# Patient Record
Sex: Male | Born: 1975
Health system: Southern US, Community
[De-identification: ages and names within clinical notes are randomized; demographics above are authoritative.]

## PROBLEM LIST (undated history)

## (undated) DIAGNOSIS — G43909 Migraine, unspecified, not intractable, without status migrainosus: Secondary | ICD-10-CM

## (undated) DIAGNOSIS — R519 Headache, unspecified: Secondary | ICD-10-CM

## (undated) HISTORY — DX: Headache, unspecified: R51.9

---

## 2008-07-16 ENCOUNTER — Ambulatory Visit: Payer: Self-pay | Admitting: Family Medicine

## 2008-07-16 DIAGNOSIS — R252 Cramp and spasm: Secondary | ICD-10-CM

## 2008-07-16 DIAGNOSIS — M25569 Pain in unspecified knee: Secondary | ICD-10-CM

## 2008-07-16 DIAGNOSIS — G44209 Tension-type headache, unspecified, not intractable: Secondary | ICD-10-CM

## 2008-07-16 DIAGNOSIS — K644 Residual hemorrhoidal skin tags: Secondary | ICD-10-CM | POA: Insufficient documentation

## 2008-07-16 DIAGNOSIS — F329 Major depressive disorder, single episode, unspecified: Secondary | ICD-10-CM

## 2008-07-17 LAB — CONVERTED CEMR LAB
BUN: 10 mg/dL (ref 6–23)
CO2: 34 meq/L — ABNORMAL HIGH (ref 19–32)
Calcium: 9.1 mg/dL (ref 8.4–10.5)
Creatinine, Ser: 1 mg/dL (ref 0.4–1.5)
GFR calc non Af Amer: 93 mL/min

## 2008-10-04 ENCOUNTER — Encounter (INDEPENDENT_AMBULATORY_CARE_PROVIDER_SITE_OTHER): Payer: Self-pay | Admitting: *Deleted

## 2012-07-22 ENCOUNTER — Emergency Department: Payer: Self-pay | Admitting: Emergency Medicine

## 2012-07-22 LAB — URINALYSIS, COMPLETE
Bacteria: NONE SEEN
Bilirubin,UR: NEGATIVE
Blood: NEGATIVE
Glucose,UR: NEGATIVE mg/dL (ref 0–75)
Ketone: NEGATIVE
Leukocyte Esterase: NEGATIVE
Nitrite: NEGATIVE
Ph: 6 (ref 4.5–8.0)

## 2012-07-22 LAB — COMPREHENSIVE METABOLIC PANEL
Bilirubin,Total: 0.6 mg/dL (ref 0.2–1.0)
Chloride: 104 mmol/L (ref 98–107)
Creatinine: 1.08 mg/dL (ref 0.60–1.30)
EGFR (African American): >60
SGPT (ALT): 35 U/L (ref 12–78)
Total Protein: 7.5 g/dL (ref 6.4–8.2)

## 2012-07-22 LAB — CBC
HCT: 37.9 % — ABNORMAL LOW (ref 40.0–52.0)
MCV: 84 fL (ref 80–100)
RBC: 4.53 10*6/uL (ref 4.40–5.90)
WBC: 7.1 10*3/uL (ref 3.8–10.6)

## 2012-07-22 LAB — TROPONIN I: Troponin-I: <0.02 ng/mL

## 2013-03-30 ENCOUNTER — Other Ambulatory Visit: Payer: Self-pay | Admitting: Otolaryngology

## 2013-03-30 DIAGNOSIS — J029 Acute pharyngitis, unspecified: Secondary | ICD-10-CM

## 2013-03-30 DIAGNOSIS — R0989 Other specified symptoms and signs involving the circulatory and respiratory systems: Secondary | ICD-10-CM

## 2013-03-30 DIAGNOSIS — K219 Gastro-esophageal reflux disease without esophagitis: Secondary | ICD-10-CM

## 2013-04-20 ENCOUNTER — Ambulatory Visit
Admission: RE | Admit: 2013-04-20 | Discharge: 2013-04-20 | Disposition: A | Discharge status: Home / Self Care | Payer: Commercial Managed Care - PPO | Source: Ambulatory Visit | Attending: Otolaryngology | Admitting: Otolaryngology

## 2013-04-20 DIAGNOSIS — J029 Acute pharyngitis, unspecified: Secondary | ICD-10-CM

## 2013-04-20 DIAGNOSIS — R0989 Other specified symptoms and signs involving the circulatory and respiratory systems: Secondary | ICD-10-CM

## 2013-04-20 DIAGNOSIS — K219 Gastro-esophageal reflux disease without esophagitis: Secondary | ICD-10-CM

## 2019-06-14 ENCOUNTER — Emergency Department
Admission: EM | Admit: 2019-06-14 | Discharge: 2019-06-14 | Disposition: A | Discharge status: Home / Self Care | Payer: Managed Care, Other (non HMO) | Attending: Emergency Medicine | Admitting: Emergency Medicine

## 2019-06-14 ENCOUNTER — Emergency Department: Payer: Managed Care, Other (non HMO)

## 2019-06-14 ENCOUNTER — Encounter: Payer: Self-pay | Admitting: Emergency Medicine

## 2019-06-14 ENCOUNTER — Other Ambulatory Visit: Payer: Self-pay

## 2019-06-14 DIAGNOSIS — R079 Chest pain, unspecified: Secondary | ICD-10-CM | POA: Diagnosis not present

## 2019-06-14 DIAGNOSIS — R519 Headache, unspecified: Secondary | ICD-10-CM | POA: Insufficient documentation

## 2019-06-14 DIAGNOSIS — M25512 Pain in left shoulder: Secondary | ICD-10-CM | POA: Insufficient documentation

## 2019-06-14 DIAGNOSIS — R42 Dizziness and giddiness: Secondary | ICD-10-CM | POA: Insufficient documentation

## 2019-06-14 DIAGNOSIS — M542 Cervicalgia: Secondary | ICD-10-CM | POA: Diagnosis not present

## 2019-06-14 DIAGNOSIS — M25511 Pain in right shoulder: Secondary | ICD-10-CM | POA: Insufficient documentation

## 2019-06-14 HISTORY — DX: Migraine, unspecified, not intractable, without status migrainosus: G43.909

## 2019-06-14 LAB — CBC
HCT: 37.8 % — ABNORMAL LOW (ref 39.0–52.0)
Hemoglobin: 11.8 g/dL — ABNORMAL LOW (ref 13.0–17.0)
MCH: 26.7 pg (ref 26.0–34.0)
MCHC: 31.2 g/dL (ref 30.0–36.0)
MCV: 85.5 fL (ref 80.0–100.0)
Platelets: 253 10*3/uL (ref 150–400)
RBC: 4.42 MIL/uL (ref 4.22–5.81)
RDW: 12.7 % (ref 11.5–15.5)
WBC: 9.5 10*3/uL (ref 4.0–10.5)
nRBC: 0 % (ref 0.0–0.2)

## 2019-06-14 LAB — BASIC METABOLIC PANEL
Anion gap: 7 (ref 5–15)
BUN: 14 mg/dL (ref 6–20)
CO2: 29 mmol/L (ref 22–32)
Calcium: 8.5 mg/dL — ABNORMAL LOW (ref 8.9–10.3)
Chloride: 107 mmol/L (ref 98–111)
Creatinine, Ser: 1.15 mg/dL (ref 0.61–1.24)
GFR calc Af Amer: >60 mL/min (ref >60)
GFR calc non Af Amer: >60 mL/min (ref >60)
Glucose, Bld: 89 mg/dL (ref 70–99)
Potassium: 4.4 mmol/L (ref 3.5–5.1)
Sodium: 143 mmol/L (ref 135–145)

## 2019-06-14 LAB — TROPONIN I (HIGH SENSITIVITY)
Troponin I (High Sensitivity): 5 ng/L (ref <18)
Troponin I (High Sensitivity): 4 ng/L (ref <18)

## 2019-06-14 MED ORDER — IOHEXOL 350 MG/ML SOLN
75.0000 mL | Freq: Once | INTRAVENOUS | Status: AC | PRN
  Administered 2019-06-14: 75 mL via INTRAVENOUS
  Filled 2019-06-14: qty 75

## 2019-06-14 NOTE — ED Provider Notes (Signed)
[facility] Emergency Department Provider Note  Time seen: 1:42 PM  I have reviewed the triage vital signs and the nursing notes.   HISTORY  Chief Complaint Chest Pain   HPI Glen Lane is a 43 y.o. male with a past medical history of migraines presents to the emergency department for migraine and chest pain.  According to the patient around 9:00 this morning he developed sudden onset of central chest pain radiating to both shoulders and somewhat to his neck.  Patient states it felt like a weight was on his neck and shoulders.  Denies any shortness of breath cough or fever.  Denies any leg pain or swelling.  No history of cardiac disease or blood clots in the past.  Patient also states he was having a migraine earlier today but states that is very typical for him.  States he was feeling somewhat dizzy at work after the chest pain started but that has since resolved.  States most of the pain in the chest has resolved but continues to have a heavy weight feeling on both of his shoulders and the back of his lower neck.  Denies any weakness or numbness.   Past Medical History:  Diagnosis Date  . Migraines     Patient Active Problem List   Diagnosis Date Noted  . HEADACHE, TENSION 07/16/2008  . DEPRESSION 07/16/2008  . EXTERNAL HEMORRHOIDS 07/16/2008  . LEG, LOWER, PAIN 07/16/2008  . LEG CRAMPS 07/16/2008    History reviewed. No pertinent surgical history.  Prior to Admission medications   Not on File    No Known Allergies  No family history on file.  Social History Social History   Tobacco Use  . Smoking status: Never Smoker  . Smokeless tobacco: Never Used  Substance Use Topics  . Alcohol use: Not Currently  . Drug use: Not Currently    Review of Systems Constitutional: Negative for fever. Cardiovascular: Positive for chest pain, now resolved. Respiratory: Negative for shortness of breath.  Negative for cough. Gastrointestinal: Negative for  abdominal pain, vomiting  Musculoskeletal: Bilateral shoulder pain and lower neck pain Skin: Negative for skin complaints  Neurological: Moderate headache earlier, mild to minimal currently. All other ROS negative  ____________________________________________   PHYSICAL EXAM:  VITAL SIGNS: ED Triage Vitals  Enc Vitals Group     BP 06/14/19 1155 137/85     Pulse Rate 06/14/19 1155 66     Resp 06/14/19 1155 16     Temp 06/14/19 1155 99.1 F (37.3 C)     Temp src --      SpO2 06/14/19 1155 97 %     Weight 06/14/19 1156 265 lb (120.2 kg)     Height 06/14/19 1156 6' (1.829 m)     Head Circumference --      Peak Flow --      Pain Score 06/14/19 1155 6     Pain Loc --      Pain Edu? --      Excl. in [place]? --     Constitutional: Alert and oriented. Well appearing and in no distress. Eyes: Normal exam ENT      Head: Normocephalic and atraumatic.      Mouth/Throat: Mucous membranes are moist. Cardiovascular: Normal rate, regular rhythm.  No murmur. Respiratory: Normal respiratory effort without tachypnea nor retractions. Breath sounds are clear and equal Gastrointestinal: Soft and nontender. No distention.   Musculoskeletal: Nontender with normal range of motion in all extremities. No lower  extremity tenderness or edema.  Chest is nontender to palpation. Neurologic:  Normal speech and language. No gross focal neurologic deficits  Skin:  Skin is warm, dry and intact.  Psychiatric: Mood and affect are normal.   ____________________________________________    EKG  EKG viewed and interpreted by myself shows a normal sinus rhythm at 67 bpm with a narrow QRS, normal axis, normal intervals, no concerning ST changes  ____________________________________________    RADIOLOGY  Chest x-ray negative.  ____________________________________________   INITIAL IMPRESSION / ASSESSMENT AND PLAN / ED COURSE  Pertinent labs & imaging results that were available during my care of the  patient were reviewed by me and considered in my medical decision making (see chart for details).   Patient presents for sudden onset of sharp chest pain radiating to both shoulders into his lower neck.  No history of the same.  States the chest pain is since resolved but continues to have neck and shoulder pain.  Differential would include ACS, PE less likely dissection, anxiety/stress, musculoskeletal pain.  Overall the patient's exam is reassuring.  Nontender chest.  Lab work largely within normal limits, troponin negative.  However given the sudden onset of sharp pain radiating to his shoulders and neck I discussed the pros and cons of CT imaging, patient agreeable to proceed with CTA of the chest.  We will also repeat a troponin at that time.  If the patient CTA and troponin are negative anticipate likely discharge home with PCP follow-up.  Repeat troponin is negative.  CTA is negative.  Overall the patient appears very well.  We will discharge home with PCP follow-up in my typical chest pain return precautions.  [name] was evaluated in Emergency Department on 06/14/2019 for the symptoms described in the history of present illness. He was evaluated in the context of the global COVID-19 pandemic, which necessitated consideration that the patient might be at risk for infection with the SARS-CoV-2 virus that causes COVID-19. Institutional protocols and algorithms that pertain to the evaluation of patients at risk for COVID-19 are in a state of rapid change based on information released by regulatory bodies including the CDC and federal and state organizations. These policies and algorithms were followed during the patient's care in the ED.  ____________________________________________   FINAL CLINICAL IMPRESSION(S) / ED DIAGNOSES  Chest pain   Minna Antis, MD 06/14/19 1523

## 2019-06-14 NOTE — ED Triage Notes (Signed)
Pt in via POV, reports sudden onset left side chest pain w/ pain across both shoulders as well.  Reports associated dizziness, light headedness.  Denies hx of same.  NAD noted at this time.

## 2019-06-14 NOTE — ED Notes (Signed)
Pt VS did in error by this RN at 1155

## 2020-04-25 ENCOUNTER — Ambulatory Visit: Payer: Self-pay

## 2020-07-07 ENCOUNTER — Other Ambulatory Visit: Payer: Self-pay

## 2020-07-07 ENCOUNTER — Emergency Department: Payer: BC Managed Care – PPO

## 2020-07-07 ENCOUNTER — Emergency Department
Admission: EM | Admit: 2020-07-07 | Discharge: 2020-07-07 | Disposition: A | Discharge status: Home / Self Care | Payer: BC Managed Care – PPO | Attending: Emergency Medicine | Admitting: Emergency Medicine

## 2020-07-07 ENCOUNTER — Encounter: Payer: Self-pay | Admitting: Emergency Medicine

## 2020-07-07 DIAGNOSIS — Z20822 Contact with and (suspected) exposure to covid-19: Secondary | ICD-10-CM | POA: Insufficient documentation

## 2020-07-07 DIAGNOSIS — B349 Viral infection, unspecified: Secondary | ICD-10-CM | POA: Diagnosis not present

## 2020-07-07 DIAGNOSIS — R0602 Shortness of breath: Secondary | ICD-10-CM

## 2020-07-07 DIAGNOSIS — R0789 Other chest pain: Secondary | ICD-10-CM | POA: Diagnosis not present

## 2020-07-07 LAB — BASIC METABOLIC PANEL
Anion gap: 8 (ref 5–15)
BUN: 12 mg/dL (ref 6–20)
CO2: 26 mmol/L (ref 22–32)
Calcium: 8.7 mg/dL — ABNORMAL LOW (ref 8.9–10.3)
Chloride: 103 mmol/L (ref 98–111)
Creatinine, Ser: 1.02 mg/dL (ref 0.61–1.24)
GFR, Estimated: >60 mL/min (ref >60)
Glucose, Bld: 113 mg/dL — ABNORMAL HIGH (ref 70–99)
Potassium: 3.6 mmol/L (ref 3.5–5.1)
Sodium: 137 mmol/L (ref 135–145)

## 2020-07-07 LAB — CBC
HCT: 41.7 % (ref 39.0–52.0)
Hemoglobin: 13.6 g/dL (ref 13.0–17.0)
MCH: 27.2 pg (ref 26.0–34.0)
MCHC: 32.6 g/dL (ref 30.0–36.0)
MCV: 83.4 fL (ref 80.0–100.0)
Platelets: 280 10*3/uL (ref 150–400)
RBC: 5 MIL/uL (ref 4.22–5.81)
RDW: 12.7 % (ref 11.5–15.5)
WBC: 6.8 10*3/uL (ref 4.0–10.5)
nRBC: 0 % (ref 0.0–0.2)

## 2020-07-07 LAB — RESP PANEL BY RT-PCR (FLU A&B, COVID) ARPGX2
Influenza A by PCR: NEGATIVE
Influenza B by PCR: NEGATIVE
SARS Coronavirus 2 by RT PCR: NEGATIVE

## 2020-07-07 LAB — TROPONIN I (HIGH SENSITIVITY)
Troponin I (High Sensitivity): 4 ng/L (ref <18)
Troponin I (High Sensitivity): 3 ng/L (ref <18)

## 2020-07-07 MED ORDER — KETOROLAC TROMETHAMINE 30 MG/ML IJ SOLN
15.0000 mg | Freq: Once | INTRAMUSCULAR | Status: AC
  Administered 2020-07-07: 15 mg via INTRAVENOUS
  Filled 2020-07-07: qty 1

## 2020-07-07 MED ORDER — LACTATED RINGERS IV BOLUS
1000.0000 mL | Freq: Once | INTRAVENOUS | Status: AC
  Administered 2020-07-07: 1000 mL via INTRAVENOUS

## 2020-07-07 MED ORDER — ACETAMINOPHEN 500 MG PO TABS
1000.0000 mg | ORAL_TABLET | Freq: Once | ORAL | Status: AC
  Administered 2020-07-07: 1000 mg via ORAL
  Filled 2020-07-07: qty 2

## 2020-07-07 NOTE — ED Notes (Signed)
Pt reports symptoms for 2 days, following exposure to his father in law (who recently tested positive for covid).  Pt states breathing has improved, but headache has started to return

## 2020-07-07 NOTE — ED Provider Notes (Signed)
[facility] Emergency Department Provider Note ____________________________________________   First MD Initiated Contact with Patient 07/07/20 2032     (approximate)  I have reviewed the triage vital signs and the nursing notes.  HISTORY  Chief Complaint Chest Pain and Shortness of Breath   HPI Glen Lane is a 44 y.o. malewho presents to the ED for evaluation of chest pain and shortness of breath.  Chart review indicates history of migrainous headaches.  Patient reports that he is fully vaccinated for Covid, but has not received his booster shot.  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  Patient reports about 2 days of generalized feeling unwell, poor p.o. intake and intermittent chest pain.  He reports intermittent sharp chest pains that occur primarily at rest and with respirations.  He reports nonproductive cough without sputum production.  He reports a sensation of dyspnea at rest that occurred this afternoon, he told his wife about it, who urged him to come to the ED for evaluation.  He reports alteration to his taste and smell.  He denies any chest pain right now and indicates he just feels tired and is requesting water to thirst.  Reports that the sensation of a typical migraine headache today, but it self resolved without medication.   Past Medical History:  Diagnosis Date  . Migraines     Patient Active Problem List   Diagnosis Date Noted  . HEADACHE, TENSION 07/16/2008  . DEPRESSION 07/16/2008  . EXTERNAL HEMORRHOIDS 07/16/2008  . LEG, LOWER, PAIN 07/16/2008  . LEG CRAMPS 07/16/2008    History reviewed. No pertinent surgical history.  Prior to Admission medications   Not on File    Allergies Patient has no known allergies.  History reviewed. No pertinent family history.  Social History Social History   Tobacco Use  .  Smoking status: Never Smoker  . Smokeless tobacco: Never Used  Vaping Use  . Vaping Use: Never used  Substance Use Topics  . Alcohol use: Not Currently  . Drug use: Not Currently    Review of Systems  Constitutional: No fever/chills.  Positive generalized weakness. Eyes: No visual changes. ENT: No sore throat. Cardiovascular: Positive chest pain. Respiratory: Positive shortness of breath nonproductive cough. Gastrointestinal: No abdominal pain.  No nausea, no vomiting.  No diarrhea.  No constipation. Genitourinary: Negative for dysuria. Musculoskeletal: Negative for back pain. Skin: Negative for rash. Neurological: Negative for  focal weakness or numbness.  Positive for self having headache.  ____________________________________________   PHYSICAL EXAM:  VITAL SIGNS: Vitals:   07/07/20 2200 07/07/20 2230  BP: 124/89   Pulse: 80 77  Resp: 17 15  Temp:    SpO2: 98% 96%     Constitutional: Alert and oriented.  Tired or uncomfortable-appearing, but conversational in full sentences without distress. Eyes: Conjunctivae are normal. PERRL. EOMI. Head: Atraumatic. Nose: No congestion/rhinnorhea. Mouth/Throat: Mucous membranes are dry.  Oropharynx non-erythematous. Neck: No stridor. No cervical spine tenderness to palpation. Cardiovascular: Normal rate, regular rhythm. Grossly normal heart sounds.  Good peripheral circulation. Respiratory: Normal respiratory effort.  No retractions. Lungs CTAB. Gastrointestinal: Soft , nondistended, nontender to palpation. No CVA tenderness. Musculoskeletal: No lower extremity tenderness nor edema.  No joint effusions. No signs of acute trauma. Neurologic:  Normal speech and language. No gross focal neurologic deficits are appreciated. No gait instability noted. Cranial nerves II through XII intact 5/5 strength and sensation in all  4 extremities Skin:  Skin is warm, dry and intact. No rash noted. Psychiatric: Mood and affect are normal.  Speech and behavior are normal.  ____________________________________________   LABS (all labs ordered are listed, but only abnormal results are displayed)  Labs Reviewed  BASIC METABOLIC PANEL - Abnormal; Notable for the following components:      Result Value   Glucose, Bld 113 (*)    Calcium 8.7 (*)    All other components within normal limits  RESP PANEL BY RT-PCR (FLU A&B, COVID) ARPGX2  CBC  TROPONIN I (HIGH SENSITIVITY)  TROPONIN I (HIGH SENSITIVITY)   ____________________________________________  12 Lead EKG  91 bpm.  Normal axis.  Normal intervals.  Evidence of acute ischemia. ____________________________________________  RADIOLOGY  ED MD interpretation: 2 view CXR reviewed by me without evidence of acute cardiopulmonary pathology.  Official radiology report(s): DG Chest 2 View  Result Date: 07/07/2020 CLINICAL DATA:  Chest pressure, shortness of breath EXAM: CHEST - 2 VIEW COMPARISON:  Chest radiograph dated 06/14/2019 FINDINGS: The heart size and mediastinal contours are within normal limits. Both lungs are clear. The visualized skeletal structures are unremarkable. IMPRESSION: No active cardiopulmonary disease. Electronically Signed   By: Romona Curls M.D.   On: 07/07/2020 20:51    ____________________________________________   PROCEDURES and INTERVENTIONS  Procedure(s) performed (including Critical Care):  .1-3 Lead EKG Interpretation Performed by: Delton Prairie, MD Authorized by: Delton Prairie, MD     Interpretation: normal     ECG rate:  72   ECG rate assessment: normal     Rhythm: sinus rhythm     Ectopy: none     Conduction: normal      Medications  acetaminophen (TYLENOL) tablet 1,000 mg (1,000 mg Oral Given 07/07/20 2208)  ketorolac (TORADOL) 30 MG/ML injection 15 mg (15 mg Intravenous Given 07/07/20 2208)  lactated ringers bolus 1,000 mL (1,000 mLs Intravenous New Bag/Given 07/07/20 2207)     ____________________________________________   MDM / ED COURSE   Otherwise healthy 44 year old male presents to the ED after be exposed to Covid positive patient, with symptoms suggestive of COVID-19, but testing negative and likely amenable to outpatient management.  Normal vitals on room air.  Exam with entire discharging patient who has no evidence of additional acute pathology.  No evidence of distress, trauma or neurovascular deficits.  His blood work is unremarkable.  CXR shows no infiltrates to suggest bacterial pneumonia.  EKG is nonischemic.  His Covid test returns negative.  High suspicion for Covid regardless of this test result considering his constellation of symptoms and positive exposure at home.  While his first troponin is negative, second opponent is pending at the time of signout to oncoming provider.  He has no chest pain here in the ED and no indications at this time for additional work-up or hospitalization.  I suspect as long as his second troponin remains low, he will be suitable for outpatient management.   Clinical Course as of Jul 08 2319  Wynelle Link Jul 07, 2020  2251 Reassessed.  Patient reports improving symptoms.   [DS]    Clinical Course User Index [DS] Delton Prairie, MD    ____________________________________________   FINAL CLINICAL IMPRESSION(S) / ED DIAGNOSES  Final diagnoses:  Viral illness  Other chest pain  Shortness of breath     ED Discharge Orders    None       Shalon Salado Katrinka Blazing   Note:  This document was prepared using Dragon voice recognition software and may include  unintentional dictation errors.   Delton Prairie, MD 07/07/20 [phone]

## 2020-07-07 NOTE — ED Triage Notes (Addendum)
Pt arrived via POV with reports of headaches, pt has hx of migraines. Pt states earlier today he was vomiting. Pt also c/o chest pressure and shortness of breath.  Pt also states he feels like his BP is high but not currently on meds.   Pt also states he found out recently father-in-law is COVID + and pt has not been tested.

## 2020-07-07 NOTE — Discharge Instructions (Addendum)
As we discussed, you tested negative for COVID-19, your symptoms are highly suggestive of Covid or another viral illness causing you to feel poorly.  You have no evidence of pneumonia or other problems that would require antibiotics.  Because your father-in-law tested positive for Covid and you are now feeling poorly, I would urge you to treat this like you had Covid: Stay at home masked and quarantined.  It may be reasonable to get another Covid test here in the next couple days to see if it returns positive.  Please take Tylenol and ibuprofen/Advil for your pain.  It is safe to take them together, or to alternate them every few hours.  Take up to 1000mg  of Tylenol at a time, up to 4 times per day.  Do not take more than 4000 mg of Tylenol in 24 hours.  For ibuprofen, take 400-600 mg, 4-5 times per day.  Push plenty of fluids to maintain hydration and continue your normal medications to treat any migraines you may have.  If you develop any significant worsening symptoms, particular chest pains accompanied by passing out, fevers or worsening symptoms, please return to the ED.

## 2021-02-13 ENCOUNTER — Ambulatory Visit: Payer: BC Managed Care – PPO | Admitting: Family Medicine

## 2021-02-13 ENCOUNTER — Encounter: Payer: Self-pay | Admitting: Family Medicine

## 2021-02-13 ENCOUNTER — Other Ambulatory Visit: Payer: Self-pay

## 2021-02-13 VITALS — BP 129/77 | HR 72 | Ht 71.0 in | Wt 265.4 lb

## 2021-02-13 DIAGNOSIS — G43919 Migraine, unspecified, intractable, without status migrainosus: Secondary | ICD-10-CM | POA: Diagnosis not present

## 2021-02-13 DIAGNOSIS — R519 Headache, unspecified: Secondary | ICD-10-CM | POA: Insufficient documentation

## 2021-02-13 MED ORDER — NURTEC 75 MG PO TBDP: 75.0000 mg | ORAL_TABLET | Freq: Every day | ORAL | 2 refills | Status: AC | PRN

## 2021-02-13 NOTE — Progress Notes (Signed)
Subjective:    Patient ID: Glen Lane, male    DOB: Sep 29, 1975, 45 y.o.   MRN: [id number]  Glen Lane is a 45 y.o. male presenting on 02/13/2021 for Establish Care and Migraine  Establish care. No prior regular PCP recently. Lives in [place].  HPI  Chronic Migraines without aura Chronic problem since approx 2008. Unsure if any family history of migraines. He previously [facility] [place] in past He has contacted [facility] ([facility] initially regarding migraines.  Location: Frontal / bilateral temporal, also some posterior occipital region as well. Quality: throbbing frontal, and aching posteriorly Duration of headache without treatment: up to >24 hours He can get mild headaches, that are not migraines. More often he will get actual migraine HA. Current Frequency Migraines: 2 or more a week Longest duration without headache: 2 weeks without (longest recently) Accompanying symptoms:  nausea, vomiting (usually improves after vomit), sensitivity to smells, lacrimation, worsened with activity, generalized fatigue/malaise weakness  Effective treatment:  Procedures in past - tried botox injections from [facility] in past, temporarily helped. Ineffective treatment - Abortive (Sumatriptan, Rizatriptan), Preventative (Topamax, Propranolol) Has not taken SNRI SSRI TCA Gabapentin  Known triggers: Weather (high pressure storms), Intermediate fasting, no particular foods that can trigger but smells can worsen it. Possibly smoke can trigger.   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  He tried some intermediate fasting and it provoked some migraine headache. He said his wife does this diet trend and he wanted to try it.   Health  Maintenance:  COVID19 vaccine, received x 2 doses + 1 booster. Will bring card in future.   Depression screen PHQ 2/9 02/13/2021  Decreased Interest 1  Down, Depressed, Hopeless 1  PHQ - 2 Score 2  Altered sleeping 1  Tired, decreased energy 1  Change in appetite 0  Feeling bad or failure about yourself  0  Trouble concentrating 0  Moving slowly or fidgety/restless 0  Suicidal thoughts 0  PHQ-9 Score 4  Difficult doing work/chores Somewhat difficult    Past Medical History:  Diagnosis Date   Headache    Migraines    History reviewed. No pertinent surgical history. Social History   Socioeconomic History   Marital status: Married    Spouse name: Not on file   Number of children: 5   Years of education: Not on file   Highest education level: High school graduate  Occupational History   Not on file  Tobacco Use   Smoking status: Never   Smokeless tobacco: Never  Vaping Use   Vaping Use: Never used  Substance and Sexual Activity   Alcohol use: Never   Drug use: Never   Sexual activity: Not on file  Other Topics Concern   Not on file  Social History Narrative   Not on file   Social Determinants of Health   Financial Resource Strain: Not on file  Food Insecurity: Not on file  Transportation Needs: Not on file  Physical Activity: Not on file  Stress: Not on file  Social Connections: Not on file  Intimate Partner Violence: Not on file   Family History  Problem Relation Age of Onset   Alcohol abuse Father    Heart disease Father    Alzheimer's disease Father  Alzheimer's disease Maternal Grandmother 37   Alzheimer's disease Maternal Grandfather 80   No current outpatient medications on file prior to visit.   No current facility-administered medications on file prior to visit.    Review of Systems Per HPI unless specifically indicated above      Objective:    BP 129/77   Pulse 72   Ht 5\' 11"  (1.803 m)   Wt 265 lb 6.4 oz (120.4 kg)   SpO2 97%    BMI 37.02 kg/m   Wt Readings from Last 3 Encounters:  02/13/21 265 lb 6.4 oz (120.4 kg)  07/07/20 260 lb (117.9 kg)  06/14/19 265 lb (120.2 kg)    Physical Exam Vitals and nursing note reviewed.  Constitutional:      General: He is not in acute distress.    Appearance: Normal appearance. He is well-developed. He is not diaphoretic.     Comments: Well-appearing, comfortable, cooperative  HENT:     Head: Normocephalic and atraumatic.  Eyes:     General:        Right eye: No discharge.        Left eye: No discharge.     Conjunctiva/sclera: Conjunctivae normal.  Cardiovascular:     Rate and Rhythm: Normal rate.  Pulmonary:     Effort: Pulmonary effort is normal.  Skin:    General: Skin is warm and dry.     Findings: No erythema or rash.  Neurological:     Mental Status: He is alert and oriented to person, place, and time.  Psychiatric:        Mood and Affect: Mood normal.        Behavior: Behavior normal.        Thought Content: Thought content normal.     Comments: Well groomed, good eye contact, normal speech and thoughts      Results for orders placed or performed during the hospital encounter of 07/07/20  Resp Panel by RT-PCR (Flu A&B, Covid) Nasopharyngeal Swab   Specimen: Nasopharyngeal Swab; Nasopharyngeal(NP) swabs in vial transport medium  Result Value Ref Range   SARS Coronavirus 2 by RT PCR NEGATIVE NEGATIVE   Influenza A by PCR NEGATIVE NEGATIVE   Influenza B by PCR NEGATIVE NEGATIVE  Basic metabolic panel  Result Value Ref Range   Sodium 137 135 - 145 mmol/L   Potassium 3.6 3.5 - 5.1 mmol/L   Chloride 103 98 - 111 mmol/L   CO2 26 22 - 32 mmol/L   Glucose, Bld 113 (H) 70 - 99 mg/dL   BUN 12 6 - 20 mg/dL   Creatinine, Ser 07/09/20 0.61 - 1.24 mg/dL   Calcium 8.7 (L) 8.9 - 10.3 mg/dL   GFR, Estimated 0.45 >40 mL/min   Anion gap 8 5 - 15  CBC  Result Value Ref Range   WBC 6.8 4.0 - 10.5 K/uL   RBC 5.00 4.22 - 5.81 MIL/uL   Hemoglobin 13.6 13.0 - 17.0 g/dL    HCT >98 11.9 - 14.7 %   MCV 83.4 80.0 - 100.0 fL   MCH 27.2 26.0 - 34.0 pg   MCHC 32.6 30.0 - 36.0 g/dL   RDW 82.9 56.2 - 13.0 %   Platelets 280 150 - 400 K/uL   nRBC 0.0 0.0 - 0.2 %  Troponin I (High Sensitivity)  Result Value Ref Range   Troponin I (High Sensitivity) 4 <18 ng/L  Troponin I (High Sensitivity)  Result Value Ref Range   Troponin I (High Sensitivity)  3 <18 ng/L      Assessment & Plan:   Problem List Items Addressed This Visit   None Visit Diagnoses     Intractable migraine without status migrainosus, unspecified migraine type    -  Primary   Relevant Medications   NURTEC 75 MG TBDP   Other Relevant Orders   Ambulatory referral to Neurology      Consistent with chronic migraines without aura Onset 2008 >15 headache days per month Debilitating migraines impacting his daily function - Currently without active HA, well-appearing, no focal neuro deficits, tolerating PO w/o n/v  Past treatments failed Abortive - Sumatriptan, Rizatriptan, NSAIDs Preventative - Topamax, Propranolol, Botox injections Neurology consult in past w/ [facility]  Plan: 1. Start abortive therapy Nurtec ODT 75mg  daily PRN #8 pills 2 refills, through mail order pharmacy specialty 2. For minor HA - Recommend taking Ibuprofen 600-800mg  q 8 hr PRN, and can try Tylenol 1000mg  TID alternatively 3. Avoid triggers including foods, caffeine. Important to rest.  Return if uncontrolled or new concerns.  Referral to [facility] for further management of chronic migraines may benefit from other therapy in future.   Meds ordered this encounter  Medications   NURTEC 75 MG TBDP    Sig: Take 75 mg by mouth daily as needed (migraine headache). Max 1 tablet in 24 hours.    Dispense:  8 tablet    Refill:  2    30 day supply, Abortive therapy for migraine headache, failed sumatriptan, rizatriptan, topamax, propranolol, previously.       Follow up  plan: Return in about 7 months (around 09/16/2021) for 7 month Annual Physical AM apt with fasting lab AFTER.  Lucia Gaskins, DO [facility] 02/13/2021, 3:21 PM

## 2021-02-13 NOTE — Patient Instructions (Addendum)
Thank you for coming to the office today.  Dr Naomie Dean (Migraine Specialist)  [facility]   Address: [address] [place], [place] [address] Hours: 8AM-5PM Phone: [phone]  Stay tuned for contact from mail order - [facility], med should be mailed out within 24 hours, and insurance coverage if not they cover it.  Look into other medications in future to discuss with Dr Lucia Gaskins.  Emgality, Ajovy, Aimovig (injections prevention)  Bernita Raisin, Reyvow (pill prevention)  DUE for FASTING BLOOD WORK (no food or drink after midnight before the lab appointment, only water or coffee without cream/sugar on the morning of)  For Lab Results, once available within 2-3 days of blood draw, you can can log in to MyChart online to view your results and a brief explanation. Also, we can discuss results at next follow-up visit.    Please schedule a Follow-up Appointment to: Return in about 7 months (around 09/16/2021) for 7 month Annual Physical AM apt with fasting lab AFTER.  If you have any other questions or concerns, please feel free to call the office or send a message through MyChart. You may also schedule an earlier appointment if necessary.  Additionally, you may be receiving a survey about your experience at our office within a few days to 1 week by e-mail or mail. We value your feedback.  Saralyn Pilar, DO [facility], [place]

## 2021-02-14 ENCOUNTER — Ambulatory Visit: Payer: Self-pay | Admitting: Family Medicine

## 2021-02-28 ENCOUNTER — Telehealth: Payer: Self-pay | Admitting: Family Medicine

## 2021-02-28 NOTE — Telephone Encounter (Signed)
Caller name: Hayden Pedro  Relation to pt: from [employer] [phone] option [phone] fax # [phone]   Reason for call:  Checking on the status if PA for NURTEC 75 MG TBDP was received please confirm when received. Will refax to  [phone].

## 2021-03-06 NOTE — Telephone Encounter (Signed)
Completed PA and printed last office visit   Ready for fax  Saralyn Pilar, DO [facility] 03/06/2021, 12:59 PM

## 2021-03-07 NOTE — Telephone Encounter (Signed)
Faxed paperwork today Friday 03/07/21.

## 2021-05-19 ENCOUNTER — Ambulatory Visit: Payer: BC Managed Care – PPO | Admitting: Neurology

## 2021-05-19 ENCOUNTER — Encounter: Payer: Self-pay | Admitting: Neurology

## 2021-05-19 VITALS — BP 119/80 | HR 61 | Ht 70.0 in | Wt 247.6 lb

## 2021-05-19 DIAGNOSIS — G43009 Migraine without aura, not intractable, without status migrainosus: Secondary | ICD-10-CM

## 2021-05-19 MED ORDER — NURTEC 75 MG PO TBDP: 75.0000 mg | ORAL_TABLET | Freq: Every day | ORAL | 16 refills | Status: DC | PRN

## 2021-05-19 NOTE — Patient Instructions (Signed)
Nurtec once daily prn   Rimegepant oral dissolving tablet What is this medication? RIMEGEPANT (ri ME je pant) is used to treat migraine headaches with or without aura. An aura is a strange feeling or visual disturbance that warns you of an attack. It is also used to prevent migraine headaches. This medicine may be used for other purposes; ask your health care provider or pharmacist if you have questions. COMMON BRAND NAME(S): NURTEC ODT What should I tell my care team before I take this medication? They need to know if you have any of these conditions: kidney disease liver disease an unusual or allergic reaction to rimegepant, other medicines, foods, dyes, or preservatives pregnant or trying to get pregnant breast-feeding How should I use this medication? Take the medicine by mouth. Follow the directions on the prescription label. Leave the tablet in the sealed blister pack until you are ready to take it. With dry hands, open the blister and gently remove the tablet. If the tablet breaks or crumbles, throw it away and take a new tablet out of the blister pack. Place the tablet in the mouth and allow it to dissolve, and then swallow. Do not cut, crush, or chew this medicine. You do not need water to take this medicine. Talk to your pediatrician about the use of this medicine in children. Special care may be needed. Overdosage: If you think you have taken too much of this medicine contact a poison control center or emergency room at once. NOTE: This medicine is only for you. Do not share this medicine with others. What if I miss a dose? This does not apply. This medicine is not for regular use. What may interact with this medication? This medicine may interact with the following medications: certain medicines for fungal infections like fluconazole, itraconazole rifampin This list may not describe all possible interactions. Give your health care provider a list of all the medicines, herbs,  non-prescription drugs, or dietary supplements you use. Also tell them if you smoke, drink alcohol, or use illegal drugs. Some items may interact with your medicine. What should I watch for while using this medication? Visit your health care professional for regular checks on your progress. Tell your health care professional if your symptoms do not start to get better or if they get worse. What side effects may I notice from receiving this medication? Side effects that you should report to your doctor or health care professional as soon as possible: allergic reactions like skin rash, itching or hives; swelling of the face, lips, or tongue Side effects that usually do not require medical attention (report these to your doctor or health care professional if they continue or are bothersome): nausea This list may not describe all possible side effects. Call your doctor for medical advice about side effects. You may report side effects to FDA at 1-800-FDA-1088. Where should I keep my medication? Keep out of the reach of children and pets. Store at room temperature between 20 and 25 degrees C (68 and 77 degrees F). Get rid of any unused medicine after the expiration date. To get rid of medicines that are no longer needed or have expired: Take the medicine to a medicine take-back program. Check with your pharmacy or law enforcement to find a location. If you cannot return the medicine, check the label or package insert to see if the medicine should be thrown out in the garbage or flushed down the toilet. If you are not sure, ask your health  care provider. If it is safe to put it in the trash, take the medicine out of the container. Mix the medicine with cat litter, dirt, coffee grounds, or other unwanted substance. Seal the mixture in a bag or container. Put it in the trash. NOTE: This sheet is a summary. It may not cover all possible information. If you have questions about this medicine, talk to your  doctor, pharmacist, or health care provider.  2022 Elsevier/Gold Standard (2020-01-09 17:56:55)

## 2021-05-19 NOTE — Progress Notes (Signed)
[facility]    Provider:  Dr Lucia Gaskins Requesting Provider: Saralyn Pilar * Primary Care Provider:  Smitty Cords, DO  CC:  migraines  HPI:  Glen Lane is a 45 y.o. male here as requested by Saralyn Pilar * for chronic daily headaches. He has had headaches for a very long time. He can wake up with headaches. Can occur during the day. They start in the frontal area, base of the neck, into his shoulders radiating, throbbing, severe, he can barely function, affecting his work, he will wake up in the middle of the night with a headaches, can go to bed with it and wake up with it. He can be fine going to bed and then have nocturnal headaches. Snores a lot. Nurtec helps a lot. He take it acutely. Aspirin. Ha first migraine years ago, they got better but over the last year worsening (hasn't been to the headache clinic Dr Neale Burly for 10 years). Nurtec really helps. He has also been losing weight and exercising. At least 2x a week but nurtec helps. Untreated could last up to 24 hours, be severe, nausea, photophobia/phonophobia. Smoking is a trigger. A dark room helps. No aura. Weather is a huge trigger. No hx of head trauma. He had 8 mod to severe migraine days a month and not many other headaches days. No other focal neurologic deficits, associated symptoms, inciting events or modifiable factors.No other focal neurologic deficits, associated symptoms, inciting events or modifiable factors.  Reviewed notes, labs and imaging from outside physicians, which showed:  Medications tried: Sumatriptan, Rizatriptan, apirin Topamax, zonegran, propranolol, verapamil, amitriptyline, relpax   Review of Systems: Patient complains of symptoms per HPI as well as the following symptoms weight loss (intentional). Pertinent negatives and positives per HPI. All others negative.   Social History   Socioeconomic History   Marital status: Married    Spouse name: Not on file    Number of children: 5   Years of education: Not on file   Highest education level: High school graduate  Occupational History   Not on file  Tobacco Use   Smoking status: Never   Smokeless tobacco: Never  Vaping Use   Vaping Use: Never used  Substance and Sexual Activity   Alcohol use: Never   Drug use: Never   Sexual activity: Not on file  Other Topics Concern   Not on file  Social History Narrative   Not on file   Social Determinants of Health   Financial Resource Strain: Not on file  Food Insecurity: Not on file  Transportation Needs: Not on file  Physical Activity: Not on file  Stress: Not on file  Social Connections: Not on file  Intimate Partner Violence: Not on file    Family History  Problem Relation Age of Onset   Alcohol abuse Father    Heart disease Father    Alzheimer's disease Father    Alzheimer's disease Maternal Grandmother 73   Alzheimer's disease Maternal Grandfather 38   Migraines Neg Hx     Past Medical History:  Diagnosis Date   Headache    Migraines     Patient Active Problem List   Diagnosis Date Noted   Migraine without aura and without status migrainosus, not intractable 05/19/2021   Headache 02/13/2021   HEADACHE, TENSION 07/16/2008   DEPRESSION 07/16/2008   EXTERNAL HEMORRHOIDS 07/16/2008   LEG, LOWER, PAIN 07/16/2008   LEG CRAMPS 07/16/2008    History reviewed. No pertinent surgical history.  Current  Outpatient Medications  Medication Sig Dispense Refill   NURTEC 75 MG TBDP Take 75 mg by mouth daily as needed (migraine headache). Max 1 tablet in 24 hours. 8 tablet 2   Rimegepant Sulfate (NURTEC) 75 MG TBDP Take 75 mg by mouth daily as needed. For migraines. Take as close to onset of migraine as possible. One daily maximum. 10 tablet 16   No current facility-administered medications for this visit.    Allergies as of 05/19/2021   (No Known Allergies)    Vitals: BP 119/80   Pulse 61   Ht 5\' 10"  (1.778 m)   Wt 247  lb 9.6 oz (112.3 kg)   BMI 35.53 kg/m  Last Weight:  Wt Readings from Last 1 Encounters:  05/19/21 247 lb 9.6 oz (112.3 kg)   Last Height:   Ht Readings from Last 1 Encounters:  05/19/21 5\' 10"  (1.778 m)     Physical exam: Exam: Gen: NAD, conversant, well nourised, obese, well groomed                     CV: RRR, no MRG. No Carotid Bruits. No peripheral edema, warm, nontender Eyes: Conjunctivae clear without exudates or hemorrhage  Neuro: Detailed Neurologic Exam  Speech:    Speech is normal; fluent and spontaneous with normal comprehension.  Cognition:    The patient is oriented to person, place, and time;     recent and remote memory intact;     language fluent;     normal attention, concentration,     fund of knowledge Cranial Nerves:    The pupils are equal, round, and reactive to light. Pupils too small to visualize fundi. Visual fields are full to finger confrontation. Extraocular movements are intact. Trigeminal sensation is intact and the muscles of mastication are normal. The face is symmetric. The palate elevates in the midline. Hearing intact. Voice is normal. Shoulder shrug is normal. The tongue has normal motion without fasciculations.   Coordination:    Normal   Gait:   normal.   Motor Observation:    No asymmetry, no atrophy, and no involuntary movements noted. Tone:    Normal muscle tone.    Posture:    Posture is normal. normal erect    Strength:    Strength is V/V in the upper and lower limbs.      Sensation: intact to LT     Reflex Exam:  DTR's:    Deep tendon reflexes in the upper and lower extremities are normal bilaterally.   Toes:    The toes are downgoing bilaterally.   Clonus:    Clonus is absent.    Assessment/Plan:  Patient with episodic migraines. We had a long discussion about migraine prevention, acute management, reviewed all the new medications, he is happy with nurtec prn will continue. I suggsted an MRI of the brain and a  sleep stdy if symptoms worsen or do not improve.   Nurtec prn. He declines prevention at this time  he will wake up in the middle of the night with a headaches, can go to bed with it and wake up with it. He can be fine going to bed and then have nocturnal headaches. Snores a lot. Consider seeing sleep doctor to discuss sleep apnea testing.  No red flags, not exertional or positional or vision changes/ hearing changes, focal neurologic problems: hold off on MRI but low threshold in the future to check brain MRi esp if migraine and headaches not  improving.   Discussed: To prevent or relieve headaches, try the following: Cool Compress. Lie down and place a cool compress on your head.  Avoid headache triggers. If certain foods or odors seem to have triggered your migraines in the past, avoid them. A headache diary might help you identify triggers.  Include physical activity in your daily routine. Try a daily walk or other moderate aerobic exercise.  Manage stress. Find healthy ways to cope with the stressors, such as delegating tasks on your to-do list.  Practice relaxation techniques. Try deep breathing, yoga, massage and visualization.  Eat regularly. Eating regularly scheduled meals and maintaining a healthy diet might help prevent headaches. Also, drink plenty of fluids.  Follow a regular sleep schedule. Sleep deprivation might contribute to headaches Consider biofeedback. With this mind-body technique, you learn to control certain bodily functions -- such as muscle tension, heart rate and blood pressure -- to prevent headaches or reduce headache pain.    Proceed to emergency room if you experience new or worsening symptoms or symptoms do not resolve, if you have new neurologic symptoms or if headache is severe, or for any concerning symptom.   Provided education and documentation from American headache Society toolbox including articles on: chronic migraine medication overuse headache, chronic  migraines, prevention of migraines, behavioral and other nonpharmacologic treatments for headache.   No orders of the defined types were placed in this encounter.  Meds ordered this encounter  Medications   Rimegepant Sulfate (NURTEC) 75 MG TBDP    Sig: Take 75 mg by mouth daily as needed. For migraines. Take as close to onset of migraine as possible. One daily maximum.    Dispense:  10 tablet    Refill:  16    Dispense 16 or max allowed by insurance     Cc: Althea Charon, Saralyn Pilar, Netta Neat, DO  Naomie Dean, MD  [facility] [address] [place], [place] [address]  Phone [phone] Fax [phone]  I spent over 40 minutes of face-to-face and non-face-to-face time with patient on the  1. Migraine without aura and without status migrainosus, not intractable    diagnosis.  This included previsit chart review, lab review, study review, order entry, electronic health record documentation, patient education on the different diagnostic and therapeutic options, counseling and coordination of care, risks and benefits of management, compliance, or risk factor reduction

## 2021-07-14 ENCOUNTER — Telehealth: Payer: Self-pay | Admitting: *Deleted

## 2021-07-14 MED ORDER — UBRELVY 100 MG PO TABS: 100.0000 mg | ORAL_TABLET | ORAL | 1 refills | Status: DC | PRN

## 2021-07-14 NOTE — Addendum Note (Signed)
Addended by: Bertram Savin on: 07/14/2021 11:00 AM   Modules accepted: Orders

## 2021-07-14 NOTE — Telephone Encounter (Signed)
I called the pt and LVM (ok per DPR) advising pt that his insurance is not covering the Nurtec and instead they prefer Ubrelvy first. I advised that Dr Lucia Gaskins is willing to switch him to [place] and I left our office number for him to give [place] a call back and discuss.

## 2021-07-14 NOTE — Telephone Encounter (Signed)
Nurtec PA initiated on Cover My Meds. [employer] asking if patient has tried and failed Ubrelvy first. He has not per our records. Will address with Dr Lucia Gaskins.

## 2021-08-12 IMAGING — CR DG CHEST 2V
1 series · 2 of 2 positions shown · non-contrast
Comparison: Chest radiograph dated 06/14/2019

CLINICAL DATA: Chest pressure, shortness of breath

EXAM:
CHEST - 2 VIEW

[Series 1: dg chest 2 view · 0.14mm/px · 2 of 2 slices shown]
[im 1/2]
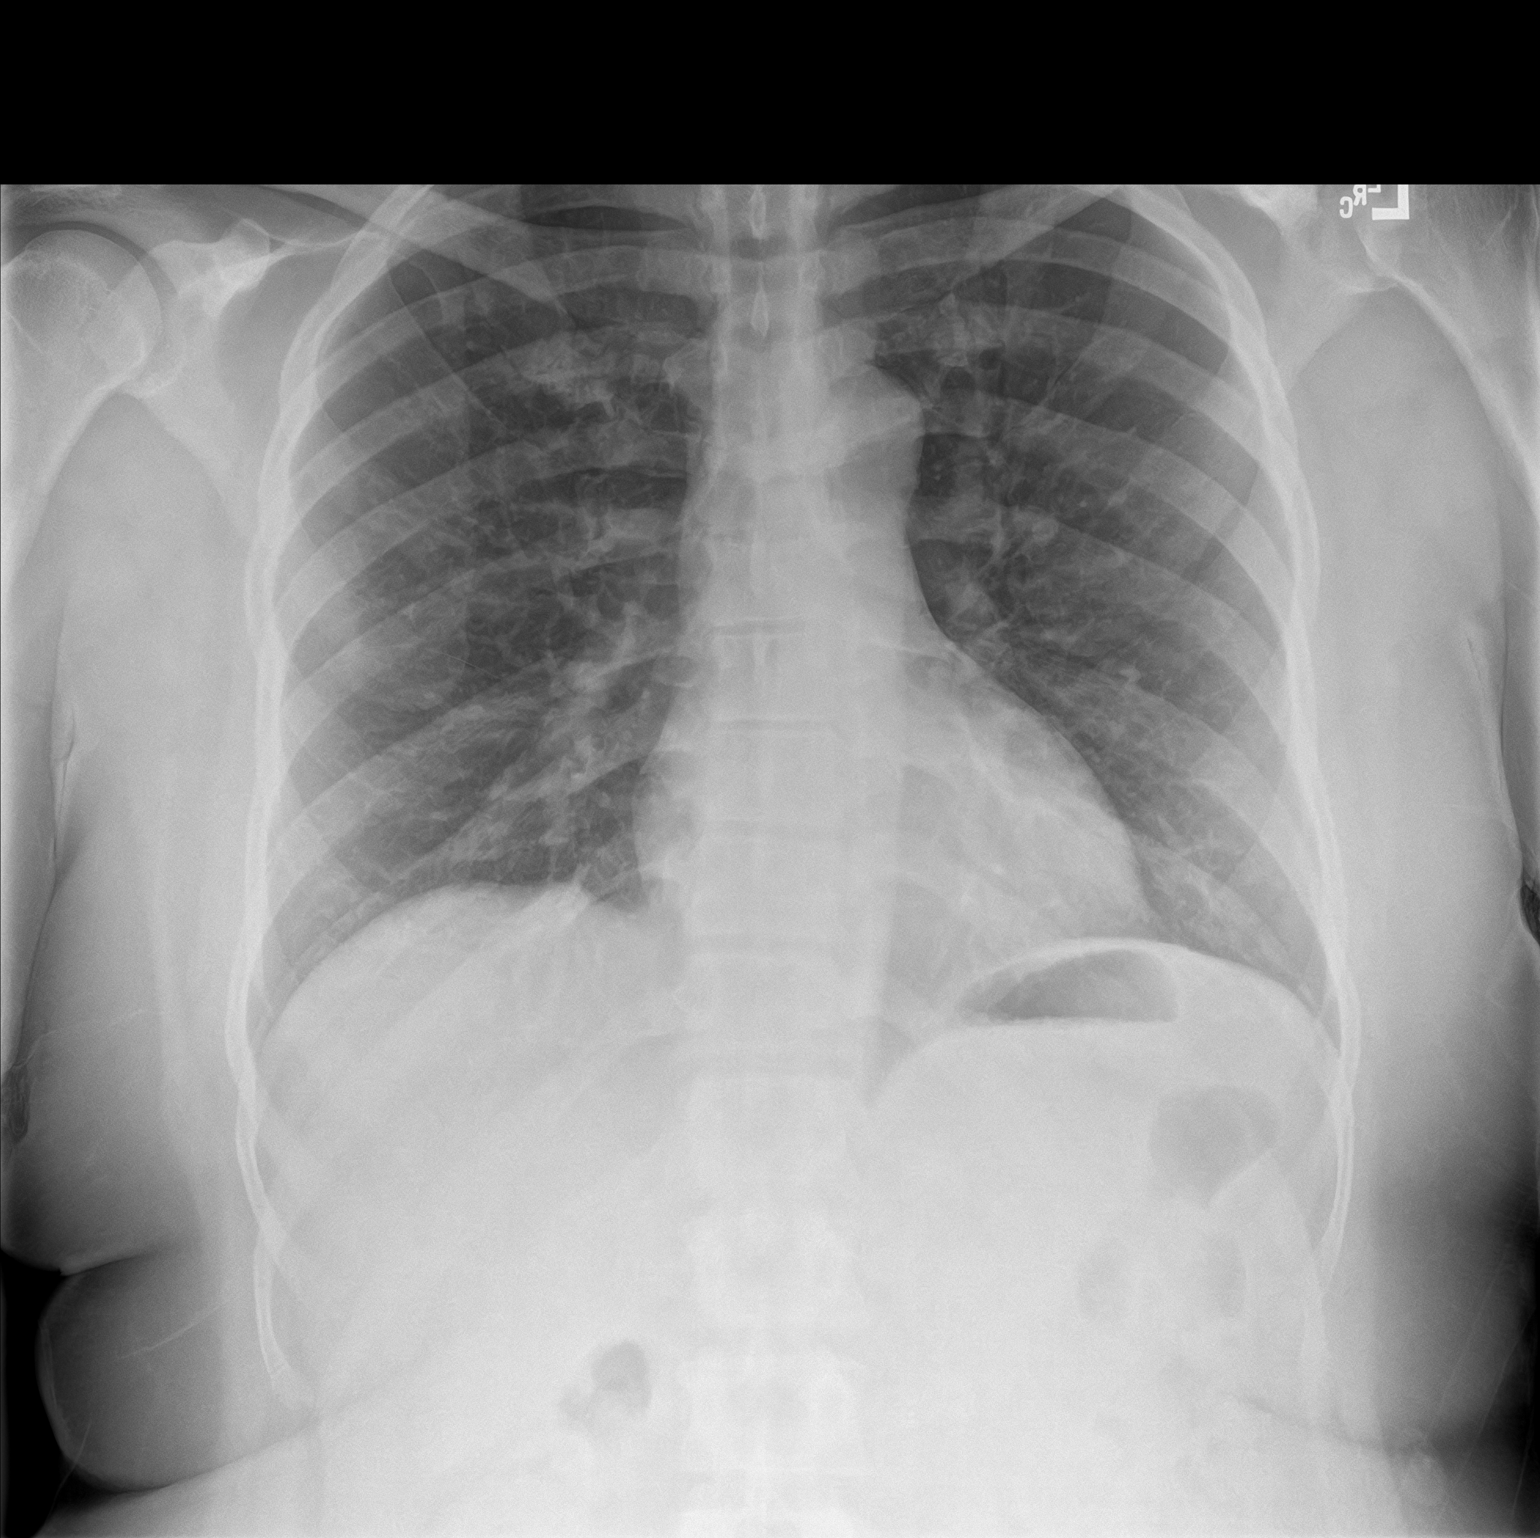
[im 2/2]
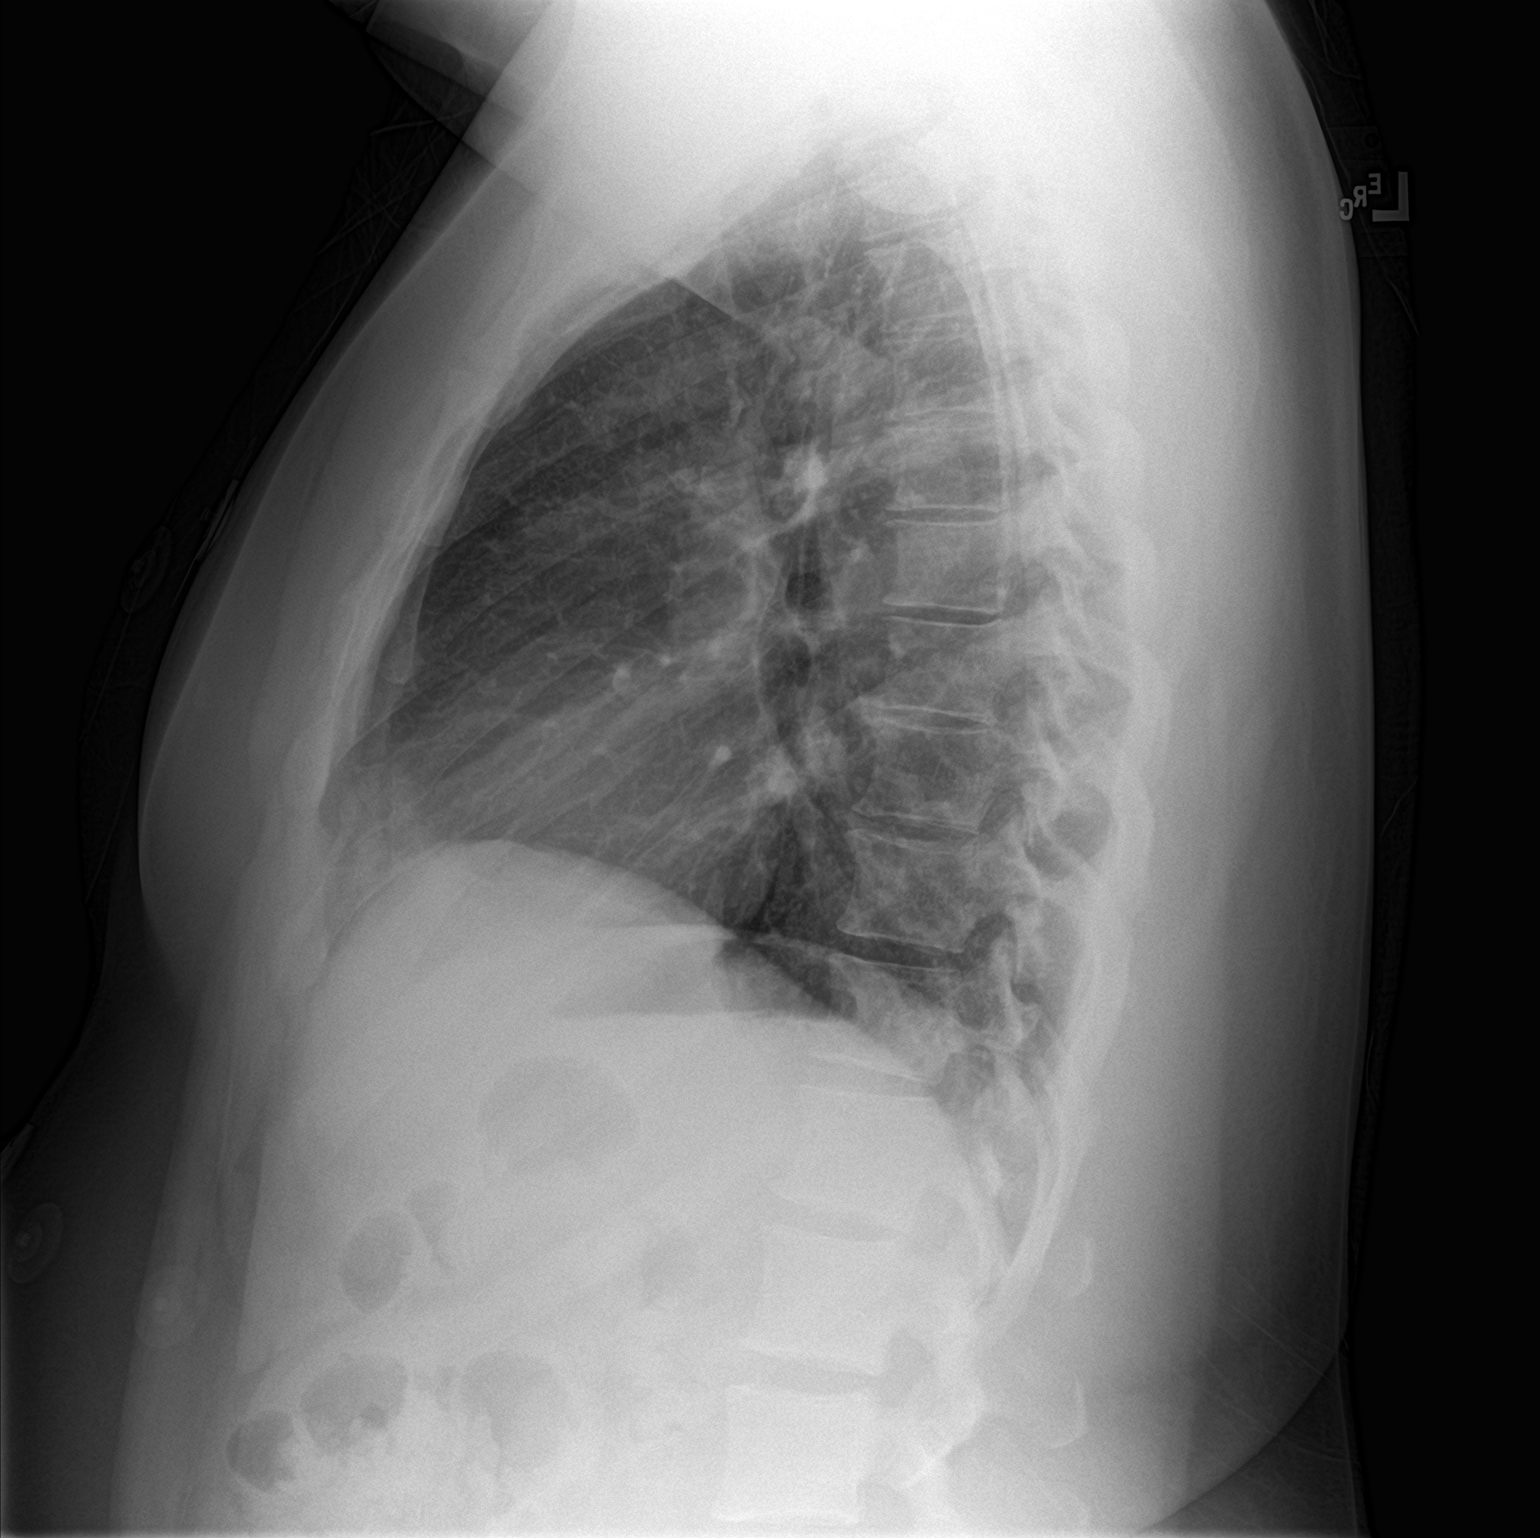

[2 of 2 positions shown; findings below may reference images not displayed]

FINDINGS: The heart size and mediastinal contours are within normal limits.
Both lungs are clear. The visualized skeletal structures are
unremarkable.
IMPRESSION: No active cardiopulmonary disease.

## 2021-08-13 ENCOUNTER — Telehealth: Payer: Self-pay | Admitting: *Deleted

## 2021-08-13 NOTE — Telephone Encounter (Signed)
Bernita Raisin PA completed on Cover My Meds. Key: [id number]. Approved immediately by [employer]. Effective from 08/13/2021 through 11/04/2021. pt aware.

## 2021-09-25 NOTE — Progress Notes (Signed)
Chief Complaint  Patient presents with   Follow-up    Pt alone, rm 1. States that he is having migraines every other day. Sometimes it will last for couple days. He states that feels like someone is pressing on top of head. And pressure goes into shoulders. He is not on preventative and states that the nurtec works better than the ubrelvy     HISTORY OF PRESENT ILLNESS:  09/29/21 ALL:  Glen Lane is a 46 y.o. male here today for follow up for migraines. He was seen in consult with Dr Jaynee Eagles 05/2021. He declined prevention medications and was started on Ubrelvy. Nurtec was not covered by insurance. He reports that headaches continue to worsen. He is having some sort of headache most every day. He feels that migrainous headaches occur 3-4 times a week. He is having some neck pain as well. Usually bilateral frontal pain. He is sensitive to light and sound. He has nausea with headaches. Activity can worsen but not always. Roselyn Meier has not been helpful. Nurtec seemed to work well.    HISTORY (copied from Dr Cathren Laine previous note)  HPI:  Glen Lane is a 46 y.o. male here as requested by Nobie Putnam * for chronic daily headaches. He has had headaches for a very long time. He can wake up with headaches. Can occur during the day. They start in the frontal area, base of the neck, into his shoulders radiating, throbbing, severe, he can barely function, affecting his work, he will wake up in the middle of the night with a headaches, can go to bed with it and wake up with it. He can be fine going to bed and then have nocturnal headaches. Snores a lot. Nurtec helps a lot. He take it acutely. Aspirin. Ha first migraine years ago, they got better but over the last year worsening (hasn't been to the headache clinic Dr Domingo Cocking for 10 years). Nurtec really helps. He has also been losing weight and exercising. At least 2x a week but nurtec helps. Untreated could last up to 24 hours, be severe,  nausea, photophobia/phonophobia. Smoking is a trigger. A dark room helps. No aura. Weather is a huge trigger. No hx of head trauma. He had 8 mod to severe migraine days a month and not many other headaches days. No other focal neurologic deficits, associated symptoms, inciting events or modifiable factors.No other focal neurologic deficits, associated symptoms, inciting events or modifiable factors.   Reviewed notes, labs and imaging from outside physicians, which showed:   Medications tried: Sumatriptan, Rizatriptan, apirin Topamax, zonegran, propranolol, verapamil, amitriptyline, relpax     REVIEW OF SYSTEMS: Out of a complete 14 system review of symptoms, the patient complains only of the following symptoms, headaches, neck pain, and all other reviewed systems are negative.   ALLERGIES: No Known Allergies   HOME MEDICATIONS: Outpatient Medications Prior to Visit  Medication Sig Dispense Refill   NURTEC 75 MG TBDP Take 75 mg by mouth daily as needed (migraine headache). Max 1 tablet in 24 hours. 8 tablet 2   Ubrogepant (UBRELVY) 100 MG TABS Take 100 mg by mouth every 2 (two) hours as needed (at onset of migraine.). Do not exceed 2 doses (200 mg) in 24 hours. 16 tablet 1   No facility-administered medications prior to visit.     PAST MEDICAL HISTORY: Past Medical History:  Diagnosis Date   Headache    Migraines      PAST SURGICAL HISTORY: History reviewed. No  pertinent surgical history.   FAMILY HISTORY: Family History  Problem Relation Age of Onset   Alcohol abuse Father    Heart disease Father    Alzheimer's disease Father    Alzheimer's disease Maternal Grandmother 70   Alzheimer's disease Maternal Grandfather 65   Migraines Neg Hx      SOCIAL HISTORY: Social History   Socioeconomic History   Marital status: Married    Spouse name: Not on file   Number of children: 5   Years of education: Not on file   Highest education level: High school graduate   Occupational History   Not on file  Tobacco Use   Smoking status: Never   Smokeless tobacco: Never  Vaping Use   Vaping Use: Never used  Substance and Sexual Activity   Alcohol use: Never   Drug use: Never   Sexual activity: Not on file  Other Topics Concern   Not on file  Social History Narrative   Not on file   Social Determinants of Health   Financial Resource Strain: Not on file  Food Insecurity: Not on file  Transportation Needs: Not on file  Physical Activity: Not on file  Stress: Not on file  Social Connections: Not on file  Intimate Partner Violence: Not on file     PHYSICAL EXAM  Vitals:   09/29/21 0816  BP: (!) 139/93  Pulse: (!) 59  Weight: 246 lb (111.6 kg)  Height: 6' (1.829 m)   Body mass index is 33.36 kg/m.  Generalized: Well developed, in no acute distress  Cardiology: normal rate and rhythm, no murmur auscultated  Respiratory: clear to auscultation bilaterally    Neurological examination  Mentation: Alert oriented to time, place, history taking. Follows all commands speech and language fluent Cranial nerve II-XII: Pupils were equal round reactive to light. Extraocular movements were full, visual field were full on confrontational test. Facial sensation and strength were normal. Head turning and shoulder shrug  were normal and symmetric. Motor: The motor testing reveals 5 over 5 strength of all 4 extremities. Good symmetric motor tone is noted throughout.  Gait and station: Gait is normal.   DIAGNOSTIC DATA (LABS, IMAGING, TESTING) - I reviewed patient records, labs, notes, testing and imaging myself where available.  Lab Results  Component Value Date   WBC 6.8 07/07/2020   HGB 13.6 07/07/2020   HCT 41.7 07/07/2020   MCV 83.4 07/07/2020   PLT 280 07/07/2020      Component Value Date/Time   NA 137 07/07/2020 2013   NA 140 07/22/2012 1327   K 3.6 07/07/2020 2013   K 3.9 07/22/2012 1327   CL 103 07/07/2020 2013   CL 104 07/22/2012  1327   CO2 26 07/07/2020 2013   CO2 31 07/22/2012 1327   GLUCOSE 113 (H) 07/07/2020 2013   GLUCOSE 121 (H) 07/22/2012 1327   BUN 12 07/07/2020 2013   BUN 11 07/22/2012 1327   CREATININE 1.02 07/07/2020 2013   CREATININE 1.08 07/22/2012 1327   CALCIUM 8.7 (L) 07/07/2020 2013   CALCIUM 8.4 (L) 07/22/2012 1327   PROT 7.5 07/22/2012 1327   ALBUMIN 3.5 07/22/2012 1327   AST 55 (H) 07/22/2012 1327   ALT 35 07/22/2012 1327   ALKPHOS 62 07/22/2012 1327   BILITOT 0.6 07/22/2012 1327   GFRNONAA >60 07/07/2020 2013   GFRNONAA >60 07/22/2012 1327   GFRAA >60 06/14/2019 1201   GFRAA >60 07/22/2012 1327   No results found for: CHOL, HDL, LDLCALC, LDLDIRECT,  TRIG, CHOLHDL No results found for: HGBA1C No results found for: VITAMINB12 No results found for: TSH  No flowsheet data found.   No flowsheet data found.   ASSESSMENT AND PLAN  46 y.o. year old male  has a past medical history of Headache and Migraines. here with    Migraine without aura and without status migrainosus, not intractable  Yaakov continues to have regular headache days with at least 12 migraine days a month. We have discussed multiple options for prevention including restarting medications previously tried (topiramate, amitriptyline, verapamil) versus starting CGRPs for prevention. He is hesitant to consider injections. He would like to try Nurtec every other day for prevention. We will place order, today. If not covered by insurance we can try Ajovy injection every 30 days. Could consider Qulipta as well if he prefers. He may continue Ubrelvy for abortive therapy. May combine with Tylenol and either ibuprofen or Aleve per packaging instructions, if needed. He will continue healthy lifestyle habits. Follow up with PCP for neck pain. Return to see me in 6 months, sooner if needed.   No orders of the defined types were placed in this encounter.    Meds ordered this encounter  Medications   Rimegepant Sulfate (NURTEC)  75 MG TBDP    Sig: Take 75 mg by mouth every other day.    Dispense:  16 tablet    Refill:  11    Order Specific Question:   Supervising Provider    Answer:   Melvenia Beam [id number]      Debbora Presto, MSN, FNP-C 09/29/2021, 11:56 AM  [facility] [address], [place], [place] [address] [phone]

## 2021-09-25 NOTE — Patient Instructions (Addendum)
Below is our plan:  We will start Nurtec 75mg  every other day. For now, we continue Ubrelvy as needed for abortive therapy. Please take 1 tablet at onset of headache. May take 1 additional tablet in 2 hours if needed. Do not take more than 2 tablets in 24 hours or more than 10 in a month. You can take with Tylenol 1000mg  and EITHER ibuprofen 800mg  or Aleve 220mg . Benadryl can help as well.   Please make sure you are staying well hydrated. I recommend 50-60 ounces daily. Well balanced diet and regular exercise encouraged. Consistent sleep schedule with 6-8 hours recommended.   Please continue follow up with care team as directed.   Follow up with me in 4-6 months   You may receive a survey regarding today's visit. I encourage you to leave honest feed back as I do use this information to improve patient care. Thank you for seeing me today!

## 2021-09-29 ENCOUNTER — Other Ambulatory Visit: Payer: Self-pay

## 2021-09-29 ENCOUNTER — Encounter: Payer: Self-pay | Admitting: Family Medicine

## 2021-09-29 ENCOUNTER — Ambulatory Visit: Payer: BC Managed Care – PPO | Admitting: Family Medicine

## 2021-09-29 VITALS — BP 139/93 | HR 59 | Ht 72.0 in | Wt 246.0 lb

## 2021-09-29 DIAGNOSIS — G43909 Migraine, unspecified, not intractable, without status migrainosus: Secondary | ICD-10-CM

## 2021-09-29 DIAGNOSIS — G43009 Migraine without aura, not intractable, without status migrainosus: Secondary | ICD-10-CM | POA: Diagnosis not present

## 2021-09-29 MED ORDER — NURTEC 75 MG PO TBDP: 75.0000 mg | ORAL_TABLET | ORAL | 11 refills | Status: DC

## 2021-10-08 ENCOUNTER — Encounter: Payer: Self-pay | Admitting: Family Medicine

## 2021-10-08 ENCOUNTER — Other Ambulatory Visit: Payer: Self-pay

## 2021-10-08 ENCOUNTER — Ambulatory Visit: Payer: BC Managed Care – PPO | Admitting: Family Medicine

## 2021-10-08 VITALS — BP 141/81 | HR 69 | Ht 72.0 in | Wt 246.4 lb

## 2021-10-08 DIAGNOSIS — G43919 Migraine, unspecified, intractable, without status migrainosus: Secondary | ICD-10-CM

## 2021-10-08 DIAGNOSIS — M542 Cervicalgia: Secondary | ICD-10-CM

## 2021-10-08 DIAGNOSIS — M62838 Other muscle spasm: Secondary | ICD-10-CM | POA: Diagnosis not present

## 2021-10-08 MED ORDER — BACLOFEN 10 MG PO TABS
5.0000 mg | ORAL_TABLET | Freq: Three times a day (TID) | ORAL | 2 refills | Status: AC | PRN
Start: 2021-10-08 — End: ?

## 2021-10-08 NOTE — Patient Instructions (Addendum)
Thank you for coming to the office today. ? ?Most likely muscular symptoms with spasm or repetitive strain, can be directly due to physical symptoms of the migraine, ultimately if migraines are resolved, hopefully the neck will improve. ? ?Otherwise, can be more tension related neck pain ? ?Good news unlikely to have any nerve impingement, and unlikely for any shoulder rotator cuff impairment. ? ?------------------- ? ?Start taking Baclofen (Lioresal) 10mg  (muscle relaxant) - start with half (cut) to one whole pill at night as needed for next 1-3 nights (may make you drowsy, caution with driving) see how it affects you, then if tolerated increase to one pill 2 to 3 times a day or (every 8 hours as needed) ? ?START anti inflammatory topical - OTC Voltaren (generic Diclofenac) topical 2-4 times a day as needed for pain swelling of affected joint for 1-2 weeks or longer. ? ?If need can do ibuprofen or aleve. ? ?Future consider X-ray and or Physical Therapy. ? ?Please schedule a Follow-up Appointment to: Return if symptoms worsen or fail to improve. ? ?If you have any other questions or concerns, please feel free to call the office or send a message through [place]. You may also schedule an earlier appointment if necessary. ? ?Additionally, you may be receiving a survey about your experience at our office within a few days to 1 week by e-mail or mail. We value your feedback. ? ?Nobie Putnam, DO ?[place] ?

## 2021-10-08 NOTE — Progress Notes (Signed)
? ?Subjective:  ? ? Patient ID: Glen Lane, male    DOB: 1975-12-20, 46 y.o.   MRN: [id number] ? ?Glen Lane is a 46 y.o. male presenting on 10/08/2021 for Neck Pain and Migraine ? ? ?HPI ? ?MIgraine ?Neck pain ?Initially seen by me to establish 02/13/21 managed for migraines ?Initially on Nurtec ODT with significant improvement, and ultimately seen by Dr Lucia [facility] [place] [facility], was approved on Ubrelvy instead, and did not work as well, he was then switched back to Nurtec ODT to try to get this approved/authorized again. ? ?Currently still taking Ubrelvy but less improvement. Now has Nurtec ODT samples. ? ?Last seen by Neurology 1 week ago 09/29/21, with experiencing mostly daily headaches approx 4+ days per week. ? ?Now significant new change in past 1 month, with neck pain and pressure, feels like back of neck there is direct pressure like someone pushing down on his neck. Feels like head being pushed down into his body / shoulders. Left is worse than Right. Seems to last for hours / duration of migraine. ? ?No prior injury to neck. No prior neck imaging. ?He has no radiating symptoms, from neck. Not having paresthesia into arms. ? ?Not tried much ibuprofen / tylenol PRN. ? ? ? ?Depression screen [facility] 2/9 02/13/2021  ?Decreased Interest 1  ?Down, Depressed, Hopeless 1  ?PHQ - 2 Score 2  ?Altered sleeping 1  ?Tired, decreased energy 1  ?Change in appetite 0  ?Feeling bad or failure about yourself  0  ?Trouble concentrating 0  ?Moving slowly or fidgety/restless 0  ?Suicidal thoughts 0  ?PHQ-9 Score 4  ?Difficult doing work/chores Somewhat difficult  ? ? ?Social History  ? ?Tobacco Use  ? Smoking status: Never  ? Smokeless tobacco: Never  ?Vaping Use  ? Vaping Use: Never used  ?Substance Use Topics  ? Alcohol use: Never  ? Drug use: Never  ? ? ?Review of Systems ?Per HPI unless specifically indicated above ? ?   ?Objective:  ?  ?BP (!) 141/81   Pulse 69   Ht 6' (1.829 m)   Wt 246 lb 6.4 oz (111.8 kg)   SpO2 98%    BMI 33.42 kg/m?   ?Wt Readings from Last 3 Encounters:  ?10/08/21 246 lb 6.4 oz (111.8 kg)  ?09/29/21 246 lb (111.6 kg)  ?05/19/21 247 lb 9.6 oz (112.3 kg)  ?  ?Physical Exam ?Vitals and nursing note reviewed.  ?Constitutional:   ?   General: He is not in acute distress. ?   Appearance: He is well-developed. He is not diaphoretic.  ?   Comments: Well-appearing, comfortable, cooperative  ?HENT:  ?   Head: Normocephalic and atraumatic.  ?Eyes:  ?   General:     ?   Right eye: No discharge.     ?   Left eye: No discharge.  ?   Conjunctiva/sclera: Conjunctivae normal.  ?Neck:  ?   Thyroid: No thyromegaly.  ?   Comments: Spurling's negative bilateral ?Some upper trapezius spasm hypertonicity symmetrical but no focal paraspinal neck muscle hypertonicity ?Cardiovascular:  ?   Rate and Rhythm: Normal rate and regular rhythm.  ?   Pulses: Normal pulses.  ?   Heart sounds: Normal heart sounds. No murmur heard. ?Pulmonary:  ?   Effort: Pulmonary effort is normal. No respiratory distress.  ?   Breath sounds: Normal breath sounds. No wheezing or rales.  ?Musculoskeletal:     ?   General: Normal range of motion.  ?  Cervical back: Normal range of motion and neck supple. No rigidity or tenderness.  ?Lymphadenopathy:  ?   Cervical: No cervical adenopathy.  ?Skin: ?   General: Skin is warm and dry.  ?   Findings: No erythema or rash.  ?Neurological:  ?   Mental Status: He is alert and oriented to person, place, and time. Mental status is at baseline.  ?Psychiatric:     ?   Behavior: Behavior normal.  ?   Comments: Well groomed, good eye contact, normal speech and thoughts  ? ? ? ?Results for orders placed or performed during the hospital encounter of 07/07/20  ?Resp Panel by RT-PCR (Flu A&B, Covid) Nasopharyngeal Swab  ? Specimen: Nasopharyngeal Swab; Nasopharyngeal(NP) swabs in vial transport medium  ?Result Value Ref Range  ? SARS Coronavirus 2 by RT PCR NEGATIVE NEGATIVE  ? Influenza A by PCR NEGATIVE NEGATIVE  ? Influenza B by  PCR NEGATIVE NEGATIVE  ?Basic metabolic panel  ?Result Value Ref Range  ? Sodium 137 135 - 145 mmol/L  ? Potassium 3.6 3.5 - 5.1 mmol/L  ? Chloride 103 98 - 111 mmol/L  ? CO2 26 22 - 32 mmol/L  ? Glucose, Bld 113 (H) 70 - 99 mg/dL  ? BUN 12 6 - 20 mg/dL  ? Creatinine, Ser 1.02 0.61 - 1.24 mg/dL  ? Calcium 8.7 (L) 8.9 - 10.3 mg/dL  ? GFR, Estimated >60 >60 mL/min  ? Anion gap 8 5 - 15  ?CBC  ?Result Value Ref Range  ? WBC 6.8 4.0 - 10.5 K/uL  ? RBC 5.00 4.22 - 5.81 MIL/uL  ? Hemoglobin 13.6 13.0 - 17.0 g/dL  ? HCT 41.7 39.0 - 52.0 %  ? MCV 83.4 80.0 - 100.0 fL  ? MCH 27.2 26.0 - 34.0 pg  ? MCHC 32.6 30.0 - 36.0 g/dL  ? RDW 12.7 11.5 - 15.5 %  ? Platelets 280 150 - 400 K/uL  ? nRBC 0.0 0.0 - 0.2 %  ?Troponin I (High Sensitivity)  ?Result Value Ref Range  ? Troponin I (High Sensitivity) 4 <18 ng/L  ?Troponin I (High Sensitivity)  ?Result Value Ref Range  ? Troponin I (High Sensitivity) 3 <18 ng/L  ? ?   ?Assessment & Plan:  ? ?Problem List Items Addressed This Visit   ?None ?Visit Diagnoses   ? ? Muscle spasms of neck    -  Primary  ? Relevant Medications  ? baclofen (LIORESAL) 10 MG tablet  ? Bilateral posterior neck pain      ? Relevant Medications  ? baclofen (LIORESAL) 10 MG tablet  ? Intractable migraine without status migrainosus, unspecified migraine type      ? Relevant Medications  ? baclofen (LIORESAL) 10 MG tablet  ? ?  ?  ?Most likely muscular symptoms with spasm or repetitive strain, can be directly due to physical symptoms of the migraine, ultimately if migraines are resolved, hopefully the neck will improve. ? ?Otherwise, can be more tension related neck pain ? ?unlikely to have any nerve impingement, and unlikely for any shoulder rotator cuff impairment. ? ?Continue to follow Neurology [facility] for migraine management, hopefully if migraines can become controlled, then neck pain will improve as well ? ?Start taking Baclofen (Lioresal) 10mg  (muscle relaxant) - start with half (cut) to one whole pill at night  as needed for next 1-3 nights (may make you drowsy, caution with driving) see how it affects you, then if tolerated increase to one pill 2 to 3 times  a day or (every 8 hours as needed) ? ?START anti inflammatory topical - OTC Voltaren (generic Diclofenac) topical 2-4 times a day as needed for pain swelling of affected joint for 1-2 weeks or longer. ? ?If need can do ibuprofen or aleve. ? ?Future consider X-ray and or Physical Therapy. ? ? ?Meds ordered this encounter  ?Medications  ? baclofen (LIORESAL) 10 MG tablet  ?  Sig: Take 0.5-1 tablets (5-10 mg total) by mouth 3 (three) times daily as needed for muscle spasms.  ?  Dispense:  30 each  ?  Refill:  2  ? ? ?Follow up plan: ?Return if symptoms worsen or fail to improve. ? ?Saralyn Pilar, DO ?[facility] ?[facility] ?10/08/2021, 3:55 PM ?

## 2021-10-21 NOTE — Telephone Encounter (Signed)
Completed Bernita Raisin renewal PA on Cover My Meds. Key: [id number] - Rx #: [id number]. Awaiting determination from [employer].  ?

## 2021-10-22 NOTE — Telephone Encounter (Signed)
Approved on March 14 ?Effective from 10/21/2021 through 10/21/2022. ?

## 2021-10-27 ENCOUNTER — Telehealth: Payer: Self-pay | Admitting: Family Medicine

## 2021-10-27 NOTE — Telephone Encounter (Signed)
[facility] Lelon Mast) Following up on PA for Rimegepant Sulfate (NURTEC) 75 MG TBDP.  Would like a call from the nurse. ?

## 2021-10-27 NOTE — Telephone Encounter (Signed)
Reviewed pt chart. He last saw AL,NP 09/29/21. Looks like Nurtec prescribe as preventative. ? ?Called back and spoke w/ [place]. Confirmed PA needed. Aware we never received request to complete but we will work on this for the pt. She verbalized understanding.  ? ?Submitted PA on CMM. Key: [id number]. Waiting on determination from [place]. ?

## 2021-11-05 NOTE — Telephone Encounter (Signed)
Sample provided: 2 boxes of Nurtec: Lot: [id number], exp: 09/2023. NDC: [id number]. Each box contains 2 ODTS.  ?

## 2021-11-05 NOTE — Telephone Encounter (Signed)
Called [employer] at [phone]. Spoke w/ [place]/care management. Pt ID: [id number]. Transferred me to pharmacy department. Spoke w/ Amy L.Appeal still pending. 21 days left to be reviewed (they have 30 days to review)  ?

## 2021-11-11 ENCOUNTER — Telehealth: Payer: Self-pay | Admitting: Family Medicine

## 2021-11-11 NOTE — Telephone Encounter (Signed)
See previous phone note from 10/27/21. PA completed/denied. Appeal under determination with insurance. Waiting on response from insurance at this point. ?

## 2021-11-11 NOTE — Telephone Encounter (Signed)
[facility] Colin Mulders) want to make sure PA form for Nurtec was received.  ?

## 2021-11-25 ENCOUNTER — Telehealth: Payer: Self-pay | Admitting: *Deleted

## 2021-11-25 ENCOUNTER — Other Ambulatory Visit: Payer: Self-pay | Admitting: *Deleted

## 2021-11-25 DIAGNOSIS — G43009 Migraine without aura, not intractable, without status migrainosus: Secondary | ICD-10-CM

## 2021-11-25 MED ORDER — AJOVY 225 MG/1.5ML ~~LOC~~ SOAJ: 1.5000 mL | SUBCUTANEOUS | 11 refills | Status: AC

## 2021-11-25 NOTE — Telephone Encounter (Signed)
Submitted PA Ajovy on CMM. Key: [id number]. Waiting on determination from [place]. ?

## 2021-11-27 NOTE — Telephone Encounter (Signed)
PA approved effective from 11/25/2021 through 02/16/2022. ?

## 2022-02-05 ENCOUNTER — Ambulatory Visit: Payer: BC Managed Care – PPO | Admitting: Family Medicine

## 2022-02-17 ENCOUNTER — Other Ambulatory Visit: Payer: Self-pay | Admitting: Neurology

## 2022-08-06 ENCOUNTER — Ambulatory Visit: Payer: BC Managed Care – PPO | Admitting: Family Medicine

## 2022-08-06 ENCOUNTER — Encounter: Payer: Self-pay | Admitting: Family Medicine

## 2022-08-06 VITALS — BP 121/80 | HR 60 | Temp 97.8°F | Resp 16 | Ht 72.0 in | Wt 246.4 lb

## 2022-08-06 DIAGNOSIS — E538 Deficiency of other specified B group vitamins: Secondary | ICD-10-CM

## 2022-08-06 DIAGNOSIS — E559 Vitamin D deficiency, unspecified: Secondary | ICD-10-CM | POA: Diagnosis not present

## 2022-08-06 DIAGNOSIS — E785 Hyperlipidemia, unspecified: Secondary | ICD-10-CM

## 2022-08-06 DIAGNOSIS — R7309 Other abnormal glucose: Secondary | ICD-10-CM

## 2022-08-06 DIAGNOSIS — R5383 Other fatigue: Secondary | ICD-10-CM | POA: Diagnosis not present

## 2022-08-06 DIAGNOSIS — E291 Testicular hypofunction: Secondary | ICD-10-CM

## 2022-08-06 DIAGNOSIS — Z125 Encounter for screening for malignant neoplasm of prostate: Secondary | ICD-10-CM

## 2022-08-06 NOTE — Patient Instructions (Addendum)
Thank you for coming to the office today.  Labwork ordered today, take orders to [facility]  Results within 24-48 hours. Stay tuned, I will let you know on MyChart.  [place], [place] [address] [place] PHONE: [phone]  [facility] [address] [place], [place] [address] [place] PHONE: [phone]  ----------------------------------------------------------  Colon Cancer Screening: - For all adults age 45+ routine colon cancer screening is highly recommended.     - Recent guidelines from [place] recommend starting age of 45 - Early detection of colon cancer is important, because often there are no warning signs or symptoms, also if found early usually it can be cured. Late stage is hard to treat. - Special circumstances in patients with early family history of colon cancer, we recommend colonoscopy at a younger age (usually 79 years before the family member was diagnosed).  - Colonoscopy is the best test for colon cancer because it involves direct visualization and immediate treatment  It would require a referral  Let me know which one  [facility]) [place], [place] [address] Phone: [phone]  [facility]) [place]. [place], [place] [address] Main: [facility] [address] [place], [place] [address] Hours: 8AM-5PM Phone: [phone]    ------------------------- If you would like to proceed with Cologuard (stool DNA test) - FIRST, call your insurance company and tell them you want to check cost of Cologuard tell them CPT Code [phone] (it may be completely covered and you could get for no cost, OR max cost without any coverage is about $600). Also, keep in mind if you do NOT open the kit, and decide not to do the test, you will NOT be charged, you should contact the company if you decide not to do the test. -  If you want to proceed, you can notify us (phone message, [place], or at next visit) and we will order it for you. The test kit will be delivered to you house within about 1 week. Follow instructions to collect sample, you may call the company for any help or questions, 24/7 telephone support at [phone].  IF WE ORDER IT   It will be shipped to you directly. If not received in 2-4 weeks, call us or the company.   If you send it back and no results are received in 2-4 weeks, call us or the company as well!    - If Cologuard is NEGATIVE, then it is good for 3 years before next due - If Cologuard is POSITIVE, then it is strongly advised to get a Colonoscopy, which allows the GI doctor to locate the source of the cancer or polyp (even very early stage) and treat it by removing it. ------------------------- Follow instructions to collect sample, you may call the company for any help or questions, 24/7 telephone support at [phone].     Please schedule a Follow-up Appointment to: Return if symptoms worsen or fail to improve.  If you have any other questions or concerns, please feel free to call the office or send a message through [place]. You may also schedule an earlier appointment if necessary.  Additionally, you may be receiving a survey about your experience at our office within a few days to 1 week by e-mail or mail. We value your feedback.  Nobie Putnam, DO [place]

## 2022-08-06 NOTE — Progress Notes (Signed)
Subjective:    Patient ID: Glen Lane, male    DOB: August 24, 1975, 46 y.o.   MRN: [id number]  Glen Lane is a 46 y.o. male presenting on 08/06/2022 for Fatigue (Patient requesting testosterone levels checked. He reports fatigue, not interested in doing activities. Patient also requesting PSA lab. )   HPI  Possible Hypogonadism / Low T Erectile Dysfunction Feeling sluggish, tired Asking about Testosterone level. He went to [facility] and purchased Tadalafil, some improvement in erectile dysfunction He still wanted to get checked out He asks for PSA lab, has not had before. Now age 24+  Episode of Blood on Toilet Paper History of hemorrhoids. Not actively bothering him, he asks additionally today about this 1-2 episodes of small bright red blood on toilet paper only, not in stool, no dark stools, no abdominal pain  This has resolved.  Right Knee Side sleeping on R side, 30 min later if laying on it, will start throbbing. Not bothering him during day and with activity   Health Maintenance: Due for Colon CA Screening, reviewed options. Offer Colonoscpoy vs Cologuard now age 22+ he will let me know which one to pursue.      08/06/2022    9:43 AM 02/13/2021    2:59 PM  Depression screen PHQ 2/9  Decreased Interest 1 1  Down, Depressed, Hopeless 1 1  PHQ - 2 Score 2 2  Altered sleeping 0 1  Tired, decreased energy 2 1  Change in appetite 1 0  Feeling bad or failure about yourself  2 0  Trouble concentrating 0 0  Moving slowly or fidgety/restless 1 0  Suicidal thoughts 0 0  PHQ-9 Score 8 4  Difficult doing work/chores Not difficult at all Somewhat difficult    Social History   Tobacco Use   Smoking status: Never   Smokeless tobacco: Never  Vaping Use   Vaping Use: Never used  Substance Use Topics   Alcohol use: Never   Drug use: Never    Review of Systems Per HPI unless specifically indicated above     Objective:    BP 121/80 (BP Location: Right Arm, Patient  Position: Sitting, Cuff Size: Large)   Pulse 60   Temp 97.8 F (36.6 C) (Temporal)   Resp 16   Ht 6' (1.829 m)   Wt 246 lb 6.4 oz (111.8 kg)   SpO2 99%   BMI 33.42 kg/m   Wt Readings from Last 3 Encounters:  08/06/22 246 lb 6.4 oz (111.8 kg)  10/08/21 246 lb 6.4 oz (111.8 kg)  09/29/21 246 lb (111.6 kg)    Physical Exam Vitals and nursing note reviewed.  Constitutional:      General: He is not in acute distress.    Appearance: Normal appearance. He is well-developed. He is not diaphoretic.     Comments: Well-appearing, comfortable, cooperative  HENT:     Head: Normocephalic and atraumatic.  Eyes:     General:        Right eye: No discharge.        Left eye: No discharge.     Conjunctiva/sclera: Conjunctivae normal.  Cardiovascular:     Rate and Rhythm: Normal rate.  Pulmonary:     Effort: Pulmonary effort is normal.  Skin:    General: Skin is warm and dry.     Findings: No erythema or rash.  Neurological:     Mental Status: He is alert and oriented to person, place, and time.  Psychiatric:  Mood and Affect: Mood normal.        Behavior: Behavior normal.        Thought Content: Thought content normal.     Comments: Well groomed, good eye contact, normal speech and thoughts      Results for orders placed or performed during the hospital encounter of 07/07/20  Resp Panel by RT-PCR (Flu A&B, Covid) Nasopharyngeal Swab   Specimen: Nasopharyngeal Swab; Nasopharyngeal(NP) swabs in vial transport medium  Result Value Ref Range   SARS Coronavirus 2 by RT PCR NEGATIVE NEGATIVE   Influenza A by PCR NEGATIVE NEGATIVE   Influenza B by PCR NEGATIVE NEGATIVE  Basic metabolic panel  Result Value Ref Range   Sodium 137 135 - 145 mmol/L   Potassium 3.6 3.5 - 5.1 mmol/L   Chloride 103 98 - 111 mmol/L   CO2 26 22 - 32 mmol/L   Glucose, Bld 113 (H) 70 - 99 mg/dL   BUN 12 6 - 20 mg/dL   Creatinine, Ser 1.02 0.61 - 1.24 mg/dL   Calcium 8.7 (L) 8.9 - 10.3 mg/dL   GFR,  Estimated >60 >60 mL/min   Anion gap 8 5 - 15  CBC  Result Value Ref Range   WBC 6.8 4.0 - 10.5 K/uL   RBC 5.00 4.22 - 5.81 MIL/uL   Hemoglobin 13.6 13.0 - 17.0 g/dL   HCT 41.7 39.0 - 52.0 %   MCV 83.4 80.0 - 100.0 fL   MCH 27.2 26.0 - 34.0 pg   MCHC 32.6 30.0 - 36.0 g/dL   RDW 12.7 11.5 - 15.5 %   Platelets 280 150 - 400 K/uL   nRBC 0.0 0.0 - 0.2 %  Troponin I (High Sensitivity)  Result Value Ref Range   Troponin I (High Sensitivity) 4 <18 ng/L  Troponin I (High Sensitivity)  Result Value Ref Range   Troponin I (High Sensitivity) 3 <18 ng/L      Assessment & Plan:   Problem List Items Addressed This Visit   None Visit Diagnoses     Hypogonadism in male    -  Primary   Relevant Orders   PSA   Testosterone   Other fatigue       Relevant Orders   TSH   CBC with Differential/Platelet   Basic Metabolic Panel (BMET)   Vitamin B12 nutritional deficiency       Relevant Orders   Vitamin B12   Vitamin D deficiency       Relevant Orders   VITAMIN D 25 Hydroxy (Vit-D Deficiency, Fractures)   Hyperlipidemia, unspecified hyperlipidemia type       Relevant Orders   TSH   Basic Metabolic Panel (BMET)   Abnormal glucose       Relevant Orders   Hemoglobin 123456   Basic Metabolic Panel (BMET)   Prostate cancer screening       Relevant Orders   PSA       Fatigue / Tiredness Possible Hypogonadism ED  Discussion today on possible causes of his symptoms. Will pursue lab panel today for diagnostic approach on metabolic and other causes we can identify  Check lab panel, return AM lab when ready at [facility], printed orders, given to patient today Needs 8-9am for Testosterone Will add PSA screen as well Additional labs for baseline work up and also for vitamin deficiency, thyroid screen  He has used [facility] product Tadalafil, he can use this as needed. I can re order or update this rx if he needs  in future.  Pending lab results, will follow up with him on message / call etc, if  identify cause of his symptoms will pursue treatment or referral to Urology for HRT if indicated or repeat Testosterone lab.  If no cause identified, then we can follow up in future and discuss other secondary causes such as sleep/OSA possibility or other factors and do annual wellness physical   Orders Placed This Encounter  Procedures   PSA   Testosterone   TSH   VITAMIN D 25 Hydroxy (Vit-D Deficiency, Fractures)   Vitamin B12   CBC with Differential/Platelet   Hemoglobin 123456   Basic Metabolic Panel (BMET)       No orders of the defined types were placed in this encounter.     Follow up plan: Return if symptoms worsen or fail to improve.  Future annual physical later in 2024.  Nobie Putnam, DO [facility] 08/06/2022, 9:54 AM

## 2022-08-13 DIAGNOSIS — E785 Hyperlipidemia, unspecified: Secondary | ICD-10-CM | POA: Diagnosis not present

## 2022-08-13 DIAGNOSIS — R7309 Other abnormal glucose: Secondary | ICD-10-CM | POA: Diagnosis not present

## 2022-08-13 DIAGNOSIS — R5383 Other fatigue: Secondary | ICD-10-CM | POA: Diagnosis not present

## 2022-08-13 DIAGNOSIS — Z125 Encounter for screening for malignant neoplasm of prostate: Secondary | ICD-10-CM | POA: Diagnosis not present

## 2022-08-13 DIAGNOSIS — E559 Vitamin D deficiency, unspecified: Secondary | ICD-10-CM | POA: Diagnosis not present

## 2022-08-13 DIAGNOSIS — E538 Deficiency of other specified B group vitamins: Secondary | ICD-10-CM | POA: Diagnosis not present

## 2022-08-13 DIAGNOSIS — E291 Testicular hypofunction: Secondary | ICD-10-CM | POA: Diagnosis not present

## 2022-08-14 LAB — CBC WITH DIFFERENTIAL/PLATELET
Basophils Absolute: 0.1 10*3/uL (ref 0.0–0.2)
Basos: 1 %
EOS (ABSOLUTE): 0.2 10*3/uL (ref 0.0–0.4)
Eos: 2 %
Hematocrit: 46.2 % (ref 37.5–51.0)
Hemoglobin: 15 g/dL (ref 13.0–17.7)
Immature Grans (Abs): 0 10*3/uL (ref 0.0–0.1)
Immature Granulocytes: 0 %
Lymphocytes Absolute: 2 10*3/uL (ref 0.7–3.1)
Lymphs: 27 %
MCH: 27.6 pg (ref 26.6–33.0)
MCHC: 32.5 g/dL (ref 31.5–35.7)
MCV: 85 fL (ref 79–97)
Monocytes Absolute: 0.6 10*3/uL (ref 0.1–0.9)
Monocytes: 8 %
Neutrophils Absolute: 4.7 10*3/uL (ref 1.4–7.0)
Neutrophils: 62 %
Platelets: 287 10*3/uL (ref 150–450)
RBC: 5.43 x10E6/uL (ref 4.14–5.80)
RDW: 12 % (ref 11.6–15.4)
WBC: 7.5 10*3/uL (ref 3.4–10.8)

## 2022-08-14 LAB — BASIC METABOLIC PANEL
BUN/Creatinine Ratio: 11 (ref 9–20)
BUN: 13 mg/dL (ref 6–24)
CO2: 28 mmol/L (ref 20–29)
Calcium: 9.2 mg/dL (ref 8.7–10.2)
Chloride: 102 mmol/L (ref 96–106)
Creatinine, Ser: 1.23 mg/dL (ref 0.76–1.27)
Glucose: 89 mg/dL (ref 70–99)
Potassium: 4.6 mmol/L (ref 3.5–5.2)
Sodium: 140 mmol/L (ref 134–144)
eGFR: 74 mL/min/{1.73_m2} (ref >59)

## 2022-08-14 LAB — HEMOGLOBIN A1C
Est. average glucose Bld gHb Est-mCnc: 100 mg/dL
Hgb A1c MFr Bld: 5.1 % (ref 4.8–5.6)

## 2022-08-14 LAB — VITAMIN D 25 HYDROXY (VIT D DEFICIENCY, FRACTURES): Vit D, 25-Hydroxy: 9.2 ng/mL — ABNORMAL LOW (ref 30.0–100.0)

## 2022-08-14 LAB — TESTOSTERONE: Testosterone: 373 ng/dL (ref 264–916)

## 2022-08-14 LAB — TSH: TSH: 1.63 u[IU]/mL (ref 0.450–4.500)

## 2022-08-14 LAB — PSA: Prostate Specific Ag, Serum: 1.6 ng/mL (ref 0.0–4.0)

## 2022-08-14 LAB — VITAMIN B12: Vitamin B-12: 367 pg/mL (ref 232–1245)

## 2022-12-21 ENCOUNTER — Other Ambulatory Visit (HOSPITAL_COMMUNITY): Payer: Self-pay

## 2022-12-21 ENCOUNTER — Telehealth: Payer: Self-pay

## 2022-12-21 NOTE — Telephone Encounter (Signed)
Patient Advocate Encounter   Received notification from [facility] [employer] that prior authorization is required for Ubrelvy 100MG  tablets   Submitted: 12-21-2022 Key [id number]  Status is pending

## 2022-12-22 ENCOUNTER — Other Ambulatory Visit (HOSPITAL_COMMUNITY): Payer: Self-pay

## 2022-12-22 NOTE — Telephone Encounter (Signed)
Pharmacy Patient Advocate Encounter  Prior Authorization for Ubrelvy 100MG  tablets has been approved by [employer] (ins).    PA # PA Case ID #: [id number] Effective dates: 12/21/2022 through 12/21/2023   Copay is $0 per test claim

## 2023-12-10 ENCOUNTER — Telehealth: Payer: Self-pay

## 2023-12-10 NOTE — Telephone Encounter (Signed)
 Pharmacy Patient Advocate Encounter   Received notification from Cover[employer]Meds that prior authorization for Ubrelvy 100MG  tablets is due for renewal.   Insurance verification completed.   The patient is insured through [facility].  Action: Patient hasn't been seen in your office in over a year. Plan requires updated chart notes for PA renewal.
